# Patient Record
Sex: Female | Born: 1935 | Race: White | Hispanic: No | Smoking: Never smoker
Health system: Southern US, Community
[De-identification: ages and names within clinical notes are randomized; demographics above are authoritative.]

## PROBLEM LIST (undated history)

## (undated) DIAGNOSIS — R569 Unspecified convulsions: Secondary | ICD-10-CM

## (undated) DIAGNOSIS — F0390 Unspecified dementia without behavioral disturbance: Secondary | ICD-10-CM

## (undated) DIAGNOSIS — E079 Disorder of thyroid, unspecified: Secondary | ICD-10-CM

## (undated) DIAGNOSIS — M1990 Unspecified osteoarthritis, unspecified site: Secondary | ICD-10-CM

## (undated) DIAGNOSIS — F419 Anxiety disorder, unspecified: Secondary | ICD-10-CM

## (undated) DIAGNOSIS — H409 Unspecified glaucoma: Secondary | ICD-10-CM

## (undated) DIAGNOSIS — I10 Essential (primary) hypertension: Secondary | ICD-10-CM

## (undated) HISTORY — PX: NASAL SINUS SURGERY: SHX719

## (undated) HISTORY — PX: HERNIA REPAIR: SHX51

---

## 2015-05-14 ENCOUNTER — Encounter (HOSPITAL_COMMUNITY): Payer: Self-pay

## 2015-05-14 ENCOUNTER — Emergency Department (HOSPITAL_COMMUNITY)
Admission: EM | Admit: 2015-05-14 | Discharge: 2015-05-14 | Disposition: A | Discharge status: Home / Self Care | Payer: Medicare Other | Attending: Emergency Medicine | Admitting: Emergency Medicine

## 2015-05-14 ENCOUNTER — Emergency Department (HOSPITAL_COMMUNITY): Payer: Medicare Other

## 2015-05-14 DIAGNOSIS — R410 Disorientation, unspecified: Secondary | ICD-10-CM | POA: Diagnosis not present

## 2015-05-14 DIAGNOSIS — Z7982 Long term (current) use of aspirin: Secondary | ICD-10-CM | POA: Diagnosis not present

## 2015-05-14 DIAGNOSIS — E079 Disorder of thyroid, unspecified: Secondary | ICD-10-CM | POA: Diagnosis not present

## 2015-05-14 DIAGNOSIS — H409 Unspecified glaucoma: Secondary | ICD-10-CM | POA: Diagnosis not present

## 2015-05-14 DIAGNOSIS — F419 Anxiety disorder, unspecified: Secondary | ICD-10-CM | POA: Diagnosis not present

## 2015-05-14 DIAGNOSIS — I10 Essential (primary) hypertension: Secondary | ICD-10-CM | POA: Insufficient documentation

## 2015-05-14 DIAGNOSIS — Z79899 Other long term (current) drug therapy: Secondary | ICD-10-CM | POA: Insufficient documentation

## 2015-05-14 DIAGNOSIS — M1990 Unspecified osteoarthritis, unspecified site: Secondary | ICD-10-CM | POA: Insufficient documentation

## 2015-05-14 DIAGNOSIS — R4182 Altered mental status, unspecified: Secondary | ICD-10-CM | POA: Diagnosis present

## 2015-05-14 HISTORY — DX: Unspecified osteoarthritis, unspecified site: M19.90

## 2015-05-14 HISTORY — DX: Unspecified convulsions: R56.9

## 2015-05-14 HISTORY — DX: Disorder of thyroid, unspecified: E07.9

## 2015-05-14 HISTORY — DX: Essential (primary) hypertension: I10

## 2015-05-14 HISTORY — DX: Anxiety disorder, unspecified: F41.9

## 2015-05-14 HISTORY — DX: Unspecified glaucoma: H40.9

## 2015-05-14 LAB — URINALYSIS, ROUTINE W REFLEX MICROSCOPIC
BILIRUBIN URINE: NEGATIVE
Glucose, UA: NEGATIVE mg/dL
HGB URINE DIPSTICK: NEGATIVE
Ketones, ur: NEGATIVE mg/dL
Leukocytes, UA: NEGATIVE
Nitrite: NEGATIVE
PH: 6 (ref 5.0–8.0)
Protein, ur: NEGATIVE mg/dL
SPECIFIC GRAVITY, URINE: 1.018 (ref 1.005–1.030)

## 2015-05-14 LAB — CBC
HEMATOCRIT: 44.9 % (ref 36.0–46.0)
HEMOGLOBIN: 15.4 g/dL — AB (ref 12.0–15.0)
MCH: 31.8 pg (ref 26.0–34.0)
MCHC: 34.3 g/dL (ref 30.0–36.0)
MCV: 92.8 fL (ref 78.0–100.0)
Platelets: 208 10*3/uL (ref 150–400)
RBC: 4.84 MIL/uL (ref 3.87–5.11)
RDW: 13.3 % (ref 11.5–15.5)
WBC: 6.1 10*3/uL (ref 4.0–10.5)

## 2015-05-14 LAB — COMPREHENSIVE METABOLIC PANEL
ALBUMIN: 4.1 g/dL (ref 3.5–5.0)
ALK PHOS: 121 U/L (ref 38–126)
ALT: 23 U/L (ref 14–54)
ANION GAP: 9 (ref 5–15)
AST: 29 U/L (ref 15–41)
BUN: 15 mg/dL (ref 6–20)
CHLORIDE: 103 mmol/L (ref 101–111)
CO2: 27 mmol/L (ref 22–32)
Calcium: 10 mg/dL (ref 8.9–10.3)
Creatinine, Ser: 0.7 mg/dL (ref 0.44–1.00)
GFR calc non Af Amer: >60 mL/min (ref >60)
GLUCOSE: 110 mg/dL — AB (ref 65–99)
POTASSIUM: 4.1 mmol/L (ref 3.5–5.1)
SODIUM: 139 mmol/L (ref 135–145)
Total Bilirubin: 0.6 mg/dL (ref 0.3–1.2)
Total Protein: 6.9 g/dL (ref 6.5–8.1)

## 2015-05-14 NOTE — ED Provider Notes (Signed)
CSN: [id number]     Arrival date & time 05/14/15  1439 History   First MD Initiated Contact with Patient 05/14/15 1717     Chief Complaint  Patient presents with  . Altered Mental Status     (Consider location/radiation/quality/duration/timing/severity/associated sxs/prior Treatment) Patient is a 81 y.o. female presenting with altered mental status.  Altered Mental Status Presenting symptoms: combativeness, confusion and disorientation   Severity:  Moderate Episode history:  Continuous Duration: ongoing for weeks. Timing:  Constant Progression:  Worsening Context: dementia   Context comment:  Fell 2 weeks ago, no head CT performed Associated symptoms: no abdominal pain and no fever     Past Medical History  Diagnosis Date  . Seizures (HCC)   . Hypertension   . Thyroid disease   . Arthritis   . Glaucoma   . Anxiety    Past Surgical History  Procedure Laterality Date  . Hernia repair    . Nasal sinus surgery     Family History  Problem Relation Age of Onset  . Family history unknown: Yes   Social History  Substance Use Topics  . Smoking status: Never Smoker   . Smokeless tobacco: None  . Alcohol Use: No   OB History    Gravida Para Term Preterm AB TAB SAB Ectopic Multiple Living   3         3     Review of Systems  Constitutional: Negative for fever.  Gastrointestinal: Negative for abdominal pain.  Psychiatric/Behavioral: Positive for confusion.  All other systems reviewed and are negative.     Allergies  Review of patient's allergies indicates no known allergies.  Home Medications   Prior to Admission medications   Medication Sig Start Date End Date Taking? Authorizing Provider  amLODipine (NORVASC) 5 MG tablet Take 5 mg by mouth daily. 05/02/15  Yes Historical Provider, MD  aspirin EC 81 MG tablet Take 81 mg by mouth daily.   Yes Historical Provider, MD  calcitRIOL (ROCALTROL) 0.25 MCG capsule Take 0.25 mcg by mouth daily. 03/10/15  Yes Historical  Provider, MD  Calcium Citrate-Vitamin D (CITRACAL + D PO) Take 1 tablet by mouth daily.   Yes Historical Provider, MD  dorzolamide-timolol (COSOPT) 22.3-6.8 MG/ML ophthalmic solution Place 1 drop into both eyes 2 (two) times daily. 08/09/13  Yes Historical Provider, MD  FLUoxetine (PROZAC) 10 MG capsule Take 10 mg by mouth daily. 03/19/15  Yes Historical Provider, MD  HYDROcodone-acetaminophen (NORCO/VICODIN) 5-325 MG tablet Take 1 tablet by mouth every 6 (six) hours as needed for moderate pain.   Yes Historical Provider, MD  levETIRAcetam (KEPPRA) 750 MG tablet Take 750 mg by mouth 2 (two) times daily. 02/19/15  Yes Historical Provider, MD  levothyroxine (SYNTHROID, LEVOTHROID) 50 MCG tablet Take 50 mcg by mouth daily. 04/03/15  Yes Historical Provider, MD  Multiple Vitamin (MULTIVITAMIN WITH MINERALS) TABS tablet Take 1 tablet by mouth daily.   Yes Historical Provider, MD   BP 166/78 mmHg  Pulse 81  Temp(Src) 97.5 F (36.4 C) (Oral)  Resp 18  SpO2 98% Physical Exam  Constitutional: She appears well-developed and well-nourished.  HENT:  Head: Normocephalic and atraumatic.  Right Ear: External ear normal.  Left Ear: External ear normal.  Eyes: Conjunctivae and EOM are normal. Pupils are equal, round, and reactive to light.  Neck: Normal range of motion. Neck supple.  Cardiovascular: Normal rate, regular rhythm, normal heart sounds and intact distal pulses.   Pulmonary/Chest: Effort normal and breath sounds normal.  Abdominal: Soft. Bowel sounds are normal. There is no tenderness.  Musculoskeletal: Normal range of motion.  Neurological: She is alert. She has normal strength. No cranial nerve deficit or sensory deficit. GCS eye subscore is 4. GCS verbal subscore is 4. GCS motor subscore is 6.  Skin: Skin is warm and dry.  Vitals reviewed.   ED Course  Procedures (including critical care time) Labs Review Labs Reviewed  COMPREHENSIVE METABOLIC PANEL - Abnormal; Notable for the  following:    Glucose, Bld 110 (*)    All other components within normal limits  CBC - Abnormal; Notable for the following:    Hemoglobin 15.4 (*)    All other components within normal limits  URINE CULTURE  URINALYSIS, ROUTINE W REFLEX MICROSCOPIC (NOT AT [facility])  CBG MONITORING, ED    Imaging Review Dg Chest 2 View  05/14/2015  CLINICAL DATA:  Increasing confusion. EXAM: CHEST  2 VIEW COMPARISON:  None FINDINGS: Normal heart size. Aortic atherosclerosis identified. Lungs are hyperinflated and there are coarsened interstitial markings bilaterally. The no superimposed airspace consolidation. Upper and lower thoracic spine compression deformities are identified NR age indeterminate. Deformity involving the proximal left humerus is suspected however this is only partially visualized. IMPRESSION: 1. No acute cardiopulmonary abnormalities. 2. Suspect age-indeterminate deformity involving the proximal left humerus. If there are clinical signs or symptoms of left humerus fracture recommend dedicated radiographs of the left upper extremity. Electronically Signed   By: Signa Kellaylor  Stroud M.D.   On: 05/14/2015 19:10   Ct Head Wo Contrast  05/14/2015  CLINICAL DATA:  Patient with altered mental status after fall. No visible injury. EXAM: CT HEAD WITHOUT CONTRAST TECHNIQUE: Contiguous axial images were obtained from the base of the skull through the vertex without intravenous contrast. COMPARISON:  None. FINDINGS: Ventricles and sulci are appropriate for patient's age. Periventricular and subcortical white matter hypodensity compatible with chronic small vessel ischemic changes. No evidence for acute cortically based infarct, intracranial hemorrhage, mass lesion mass effect. Orbits are unremarkable. Hyperostosis of the left maxillary sinus. Mucosal thickening within the frontal sinus. Mastoid air cells unremarkable. Calvarium is intact. IMPRESSION: No acute intracranial process. Chronic small vessel ischemic  changes. Electronically Signed   By: Annia Beltrew  Davis M.D.   On: 05/14/2015 18:44   I have personally reviewed and evaluated these images and lab results as part of my medical decision-making.   EKG Interpretation None      MDM   Final diagnoses:  Confusion    81 y.o. female with pertinent PMH of dementia, prior seizures, anxiety presents with progressively worsening mental status without acute change.  On arrival vitals and physical exam as above.  Description of events including paranoia, intermittent agnosia, and lack of motor or sensory symptoms makes dementia likely diagnosis.  Wu unremarkable for acute pathology.  DC home to fu with pcp.    I have reviewed all laboratory and imaging studies if ordered as above  1. Confusion         Mirian MoMatthew Gentry, MD 05/14/15 2018

## 2015-05-14 NOTE — ED Notes (Signed)
Pt daughter states increasing confusion and not recognizing family members. Daughter states she fell 2 weeks ago and [facility] in [place]. Hx of dementia.

## 2015-05-14 NOTE — Discharge Instructions (Signed)

## 2015-05-15 LAB — URINE CULTURE

## 2016-07-22 ENCOUNTER — Emergency Department (HOSPITAL_COMMUNITY): Payer: Medicare Other

## 2016-07-22 ENCOUNTER — Emergency Department (HOSPITAL_COMMUNITY)
Admission: EM | Admit: 2016-07-22 | Discharge: 2016-07-22 | Disposition: A | Discharge status: Home / Self Care | Payer: Medicare Other | Attending: Physician Assistant | Admitting: Physician Assistant

## 2016-07-22 ENCOUNTER — Encounter (HOSPITAL_COMMUNITY): Payer: Self-pay | Admitting: Emergency Medicine

## 2016-07-22 DIAGNOSIS — Y939 Activity, unspecified: Secondary | ICD-10-CM | POA: Insufficient documentation

## 2016-07-22 DIAGNOSIS — R0789 Other chest pain: Secondary | ICD-10-CM | POA: Insufficient documentation

## 2016-07-22 DIAGNOSIS — S0990XA Unspecified injury of head, initial encounter: Secondary | ICD-10-CM | POA: Diagnosis present

## 2016-07-22 DIAGNOSIS — Y999 Unspecified external cause status: Secondary | ICD-10-CM | POA: Insufficient documentation

## 2016-07-22 DIAGNOSIS — Z7982 Long term (current) use of aspirin: Secondary | ICD-10-CM | POA: Diagnosis not present

## 2016-07-22 DIAGNOSIS — Y929 Unspecified place or not applicable: Secondary | ICD-10-CM | POA: Insufficient documentation

## 2016-07-22 DIAGNOSIS — W19XXXA Unspecified fall, initial encounter: Secondary | ICD-10-CM | POA: Diagnosis not present

## 2016-07-22 DIAGNOSIS — Z23 Encounter for immunization: Secondary | ICD-10-CM | POA: Diagnosis not present

## 2016-07-22 DIAGNOSIS — Z79899 Other long term (current) drug therapy: Secondary | ICD-10-CM | POA: Insufficient documentation

## 2016-07-22 DIAGNOSIS — I10 Essential (primary) hypertension: Secondary | ICD-10-CM | POA: Insufficient documentation

## 2016-07-22 DIAGNOSIS — S0181XA Laceration without foreign body of other part of head, initial encounter: Secondary | ICD-10-CM | POA: Diagnosis not present

## 2016-07-22 HISTORY — DX: Unspecified dementia, unspecified severity, without behavioral disturbance, psychotic disturbance, mood disturbance, and anxiety: F03.90

## 2016-07-22 MED ORDER — TETANUS-DIPHTH-ACELL PERTUSSIS 5-2.5-18.5 LF-MCG/0.5 IM SUSP
0.5000 mL | Freq: Once | INTRAMUSCULAR | Status: AC
  Administered 2016-07-22: 0.5 mL via INTRAMUSCULAR
  Filled 2016-07-22: qty 0.5

## 2016-07-22 MED ORDER — HYDROCODONE-ACETAMINOPHEN 5-325 MG PO TABS
1.0000 | ORAL_TABLET | Freq: Once | ORAL | Status: AC
  Administered 2016-07-22: 1 via ORAL
  Filled 2016-07-22: qty 1

## 2016-07-22 NOTE — ED Triage Notes (Signed)
Per Es, [facility] [address] Pt had an unwitnessed fall, hx of dementia. Pt has hematoma and abrasian to Left side of forehead, bleeding controlled. Pt complains of Left shoulder and side pain. Pt not on any blood thinners. Marland Kitchen.BP- 188/98, HR 72, O2 94, CBG-107.

## 2016-07-22 NOTE — ED Provider Notes (Signed)
[facility]-EMERGENCY DEPT Provider Note   CSN: [id number] Arrival date & time: 07/22/16  1740     History   Chief Complaint Chief Complaint  Patient presents with  . Fall    HPI Ledia Cihlar is a 83 y.o. female.  HPI   Patient is a 83 year old female with advanced dementia presenting after mechanical fall. Patient had multiple falls in past. Patient has dried blood in her nares.   Level V altered mental status.  Past Medical History:  Diagnosis Date  . Anxiety   . Arthritis   . Dementia   . Glaucoma   . Hypertension   . Seizures (HCC)   . Thyroid disease     There are no active problems to display for this patient.   Past Surgical History:  Procedure Laterality Date  . HERNIA REPAIR    . NASAL SINUS SURGERY      OB History    Gravida Para Term Preterm AB Living   3         3   SAB TAB Ectopic Multiple Live Births                   Home Medications    Prior to Admission medications   Medication Sig Start Date End Date Taking? Authorizing Provider  amLODipine (NORVASC) 5 MG tablet Take 5 mg by mouth daily. 05/02/15   Historical Provider, MD  aspirin EC 81 MG tablet Take 81 mg by mouth daily.    Historical Provider, MD  calcitRIOL (ROCALTROL) 0.25 MCG capsule Take 0.25 mcg by mouth daily. 03/10/15   Historical Provider, MD  Calcium Citrate-Vitamin D (CITRACAL + D PO) Take 1 tablet by mouth daily.    Historical Provider, MD  dorzolamide-timolol (COSOPT) 22.3-6.8 MG/ML ophthalmic solution Place 1 drop into both eyes 2 (two) times daily. 08/09/13   Historical Provider, MD  FLUoxetine (PROZAC) 10 MG capsule Take 10 mg by mouth daily. 03/19/15   Historical Provider, MD  HYDROcodone-acetaminophen (NORCO/VICODIN) 5-325 MG tablet Take 1 tablet by mouth every 6 (six) hours as needed for moderate pain.    Historical Provider, MD  levETIRAcetam (KEPPRA) 750 MG tablet Take 750 mg by mouth 2 (two) times daily. 02/19/15   Historical Provider, MD  levothyroxine (SYNTHROID,  LEVOTHROID) 50 MCG tablet Take 50 mcg by mouth daily. 04/03/15   Historical Provider, MD  Multiple Vitamin (MULTIVITAMIN WITH MINERALS) TABS tablet Take 1 tablet by mouth daily.    Historical Provider, MD    Family History Family History  Problem Relation Age of Onset  . Family history unknown: Yes    Social History Social History  Substance Use Topics  . Smoking status: Never Smoker  . Smokeless tobacco: Never Used  . Alcohol use No     Allergies   Patient has no known allergies.   Review of Systems Review of Systems  Unable to perform ROS: Dementia     Physical Exam Updated Vital Signs BP 189/84 (BP Location: Right Arm)   Pulse 73   Temp 97.6 F (36.4 C) (Oral)   Resp 16   SpO2 96%   Physical Exam  Constitutional: She appears well-developed and well-nourished. No distress.  HENT:  Head: Normocephalic and atraumatic.  Dried blood to the nares. Skin tear to left temporal region.  Eyes: Conjunctivae and EOM are normal. Right eye exhibits no discharge. Left eye exhibits no discharge.  Neck: Neck supple.  Cardiovascular: Normal rate and regular rhythm.   No murmur heard. Pulmonary/Chest:  Effort normal and breath sounds normal. No respiratory distress.  Tenderness to left chest wall.  Abdominal: Soft. There is no tenderness.  Musculoskeletal: She exhibits no edema.  Neurological: She is alert. No cranial nerve deficit.  Oriented to self only.  Skin: Skin is warm and dry.  Psychiatric: She has a normal mood and affect.  Nursing note and vitals reviewed.    ED Treatments / Results  Labs (all labs ordered are listed, but only abnormal results are displayed) Labs Reviewed - No data to display  EKG  EKG Interpretation None       Radiology No results found.  Procedures Procedures (including critical care time)  Medications Ordered in ED Medications  Tdap (BOOSTRIX) injection 0.5 mL (not administered)     Initial Impression / Assessment and Plan  / ED Course  I have reviewed the triage vital signs and the nursing notes.  Pertinent labs & imaging results that were available during my care of the patient were reviewed by me and considered in my medical decision making (see chart for details).      Patient is a 83 year old female presenting with fall. Patient has advanced dementia. Patient's daughter is at bedside. We'll get CT head and neck as well as chest x-ray. We offered labs and urine but patient's family member declined at this time.  Imaging reassuring.  Given friabuility, skin tear treated with bandaging. Will have her continue to use vicodin as she is already prescribed at her NH.  Patient is comfortable, ambulatory, and taking PO at time of discharge.  Patient's fammily  expressed understanding about return precautions.    Final Clinical Impressions(s) / ED Diagnoses   Final diagnoses:  None    New Prescriptions New Prescriptions   No medications on file     Daylan Boggess Randall AnLyn Alvah Gilder, MD 07/22/16 2351

## 2016-07-22 NOTE — Discharge Instructions (Signed)
Mrs. Middlekauff was seen here after falling. Her CT head neck and her chest x-ray were normal. However as wetalked about she has a high risk for developing pneumonia. Please be careful when she is walking around that she does not trip and fall. Patient may take her Vicodin as needed at home as prescribed previously.

## 2016-10-21 ENCOUNTER — Ambulatory Visit
Admission: RE | Admit: 2016-10-21 | Discharge: 2016-10-21 | Disposition: A | Discharge status: Home / Self Care | Payer: Medicare Other | Source: Ambulatory Visit | Attending: Geriatric Medicine | Admitting: Geriatric Medicine

## 2016-10-21 ENCOUNTER — Other Ambulatory Visit: Payer: Self-pay | Admitting: Geriatric Medicine

## 2016-10-21 DIAGNOSIS — M545 Low back pain, unspecified: Secondary | ICD-10-CM

## 2018-06-29 ENCOUNTER — Encounter (HOSPITAL_COMMUNITY): Payer: Self-pay

## 2018-06-29 ENCOUNTER — Inpatient Hospital Stay (HOSPITAL_COMMUNITY)
Admission: EM | Admit: 2018-06-29 | Discharge: 2018-07-03 | DRG: 480 | Disposition: A | Discharge status: Skilled Nursing Facility | Payer: Medicare Other | Source: Skilled Nursing Facility | Attending: Internal Medicine | Admitting: Internal Medicine

## 2018-06-29 DIAGNOSIS — D649 Anemia, unspecified: Secondary | ICD-10-CM | POA: Diagnosis present

## 2018-06-29 DIAGNOSIS — Z66 Do not resuscitate: Secondary | ICD-10-CM | POA: Diagnosis present

## 2018-06-29 DIAGNOSIS — S0003XA Contusion of scalp, initial encounter: Secondary | ICD-10-CM | POA: Diagnosis present

## 2018-06-29 DIAGNOSIS — S72141A Displaced intertrochanteric fracture of right femur, initial encounter for closed fracture: Secondary | ICD-10-CM | POA: Diagnosis not present

## 2018-06-29 DIAGNOSIS — D62 Acute posthemorrhagic anemia: Secondary | ICD-10-CM | POA: Diagnosis not present

## 2018-06-29 DIAGNOSIS — R569 Unspecified convulsions: Secondary | ICD-10-CM

## 2018-06-29 DIAGNOSIS — Z7982 Long term (current) use of aspirin: Secondary | ICD-10-CM

## 2018-06-29 DIAGNOSIS — Z419 Encounter for procedure for purposes other than remedying health state, unspecified: Secondary | ICD-10-CM

## 2018-06-29 DIAGNOSIS — G40909 Epilepsy, unspecified, not intractable, without status epilepticus: Secondary | ICD-10-CM | POA: Diagnosis present

## 2018-06-29 DIAGNOSIS — W01198A Fall on same level from slipping, tripping and stumbling with subsequent striking against other object, initial encounter: Secondary | ICD-10-CM | POA: Diagnosis present

## 2018-06-29 DIAGNOSIS — Y92121 Bathroom in nursing home as the place of occurrence of the external cause: Secondary | ICD-10-CM

## 2018-06-29 DIAGNOSIS — I10 Essential (primary) hypertension: Secondary | ICD-10-CM | POA: Diagnosis present

## 2018-06-29 DIAGNOSIS — H409 Unspecified glaucoma: Secondary | ICD-10-CM | POA: Diagnosis present

## 2018-06-29 DIAGNOSIS — F0390 Unspecified dementia without behavioral disturbance: Secondary | ICD-10-CM | POA: Diagnosis present

## 2018-06-29 DIAGNOSIS — S72009A Fracture of unspecified part of neck of unspecified femur, initial encounter for closed fracture: Secondary | ICD-10-CM | POA: Diagnosis present

## 2018-06-29 DIAGNOSIS — S72001A Fracture of unspecified part of neck of right femur, initial encounter for closed fracture: Secondary | ICD-10-CM | POA: Diagnosis not present

## 2018-06-29 DIAGNOSIS — E039 Hypothyroidism, unspecified: Secondary | ICD-10-CM | POA: Diagnosis present

## 2018-06-29 DIAGNOSIS — D509 Iron deficiency anemia, unspecified: Secondary | ICD-10-CM | POA: Diagnosis present

## 2018-06-29 DIAGNOSIS — S60221A Contusion of right hand, initial encounter: Secondary | ICD-10-CM | POA: Diagnosis present

## 2018-06-29 DIAGNOSIS — Z800 Family history of malignant neoplasm of digestive organs: Secondary | ICD-10-CM

## 2018-06-29 DIAGNOSIS — E43 Unspecified severe protein-calorie malnutrition: Secondary | ICD-10-CM | POA: Diagnosis present

## 2018-06-29 DIAGNOSIS — Z9181 History of falling: Secondary | ICD-10-CM

## 2018-06-29 NOTE — ED Triage Notes (Signed)
Pt comes via [facility] from [facility] for a fall this afternoon, pt had a xray tonight with a R femoral neck fracture, PTA recieved fentanyl

## 2018-06-29 NOTE — ED Provider Notes (Signed)
[facility] EMERGENCY DEPARTMENT Provider Note   CSN: [id number] Arrival date & time: 06/29/18  2326     History   Chief Complaint Chief Complaint  Patient presents with  . Fall    HPI Rachel Barry is a 85 y.o. female.  The history is provided by the patient and medical records.  Fall     LEVEL V CAVEAT:  DEMENTIA  85 y.o. F with hx of anxiety, arthritis, dementia, glaucoma, HTN, seizures, thyroid disease, presenting to the ED following a fall.  Patient resides at [facility], [employer] [employer]  She was standing up after using the bathroom today when she fell onto her right hip.  She did strike her head against the toilet, unsure about LOC.  She was complaining of hip pain, outside x-ray with right femoral neck fracture.  Also had right shoulder and chest x-ray which were normal.  Patient does take daily ASA, no other anticoagulation.  No prior hip injuries or surgeries.  Not established with orthopedics.  Daughter reports she is able to ambulate a little with her walker, states she seems to shuffle a bit.  Past Medical History:  Diagnosis Date  . Anxiety   . Arthritis   . Dementia (HCC)   . Glaucoma   . Hypertension   . Seizures (HCC)   . Thyroid disease     There are no active problems to display for this patient.   Past Surgical History:  Procedure Laterality Date  . HERNIA REPAIR    . NASAL SINUS SURGERY       OB History    Gravida  3   Para      Term      Preterm      AB      Living  3     SAB      TAB      Ectopic      Multiple      Live Births               Home Medications    Prior to Admission medications   Medication Sig Start Date End Date Taking? Authorizing Provider  acetaminophen (TYLENOL) 500 MG tablet Take 500 mg by mouth 3 (three) times daily. Taking every day per [facility]    [provider]  amLODipine (NORVASC) 2.5 MG tablet Take 2.5 mg by mouth daily.    [provider]  aspirin EC 81 MG tablet  Take 81 mg by mouth daily.    [provider]  busPIRone (BUSPAR) 5 MG tablet Take 5 mg by mouth 2 (two) times daily.    [provider]  calcitRIOL (ROCALTROL) 0.25 MCG capsule Take 0.25 mcg by mouth daily. 03/10/15   [provider]  Calcium Citrate-Vitamin D (CITRACAL + D PO) Take 1 tablet by mouth daily.    [provider]  Cranberry-Vitamin C-Inulin (UTI-STAT) LIQD Take 30 mLs by mouth daily.    [provider]  docusate sodium (COLACE) 100 MG capsule Take 100 mg by mouth daily.    [provider]  dorzolamide-timolol (COSOPT) 22.3-6.8 MG/ML ophthalmic solution Place 1 drop into both eyes 2 (two) times daily. Preservative Free 08/09/13   [provider]  FLUoxetine (PROZAC) 40 MG capsule Take 40 mg by mouth daily.    [provider]  HYDROcodone-acetaminophen (NORCO/VICODIN) 5-325 MG tablet Take 1 tablet by mouth every 6 (six) hours as needed for moderate pain.    [provider]  levETIRAcetam (KEPPRA) 500 MG tablet Take 500 mg by mouth 2 (two) times daily.    [provider]  levothyroxine (SYNTHROID, LEVOTHROID) 50 MCG tablet Take 50 mcg by mouth daily. 04/03/15   [provider]  memantine (NAMENDA XR) 28 MG CP24 24 hr capsule Take 28 mg by mouth.    [provider]  Multiple Vitamin (MULTIVITAMIN WITH MINERALS) TABS tablet Take 1 tablet by mouth daily.    [provider]  polyethylene glycol (MIRALAX / GLYCOLAX) packet Take 17 g by mouth daily.    [provider]  triamcinolone cream (KENALOG) 0.1 % Apply 1 application topically daily as needed.    [provider]    Family History Family History  Family history unknown: Yes    Social History Social History   Tobacco Use  . Smoking status: Never Smoker  . Smokeless tobacco: Never Used  Substance Use Topics  . Alcohol use: No  . Drug use: No     Allergies   Patient has no known  allergies.   Review of Systems Review of Systems  Musculoskeletal: Positive for arthralgias.  All other systems reviewed and are negative.    Physical Exam Updated Vital Signs BP (!) 147/81   Pulse 68   Temp 98.2 F (36.8 C) (Oral)   Resp 18   SpO2 92%   Physical Exam Vitals signs and nursing note reviewed.  Constitutional:      Appearance: She is well-developed.     Comments: Elderly, extremely hard of hearing  HENT:     Head: Normocephalic and atraumatic.     Comments: Small hematoma to right side of head, no open wound or laceration Eyes:     Conjunctiva/sclera: Conjunctivae normal.     Pupils: Pupils are equal, round, and reactive to light.  Neck:     Musculoskeletal: Normal range of motion.  Cardiovascular:     Rate and Rhythm: Normal rate and regular rhythm.     Heart sounds: Normal heart sounds.  Pulmonary:     Effort: Pulmonary effort is normal.     Breath sounds: Normal breath sounds.  Abdominal:     General: Bowel sounds are normal.     Palpations: Abdomen is soft.  Musculoskeletal: Normal range of motion.     Comments: Pelvis wrapped in sheet Right hip appears to be tender to touch, there is some leg shortening and external rotation, DP pulse intact Bruising noted to left lower leg, no apparent deformity; DP pulse intact  Skin:    General: Skin is warm and dry.  Neurological:     Mental Status: She is alert.     Comments: Awake, able to answer questions when speaking directly into ear, limited movement of right leg due to injury but moving other extremities when prompted      ED Treatments / Results  Labs (all labs ordered are listed, but only abnormal results are displayed) Labs Reviewed  BASIC METABOLIC PANEL - Abnormal; Notable for the following components:      Result Value   Glucose, Bld 136 (*)    Calcium 8.6 (*)    All other components within normal limits  CBC WITH DIFFERENTIAL/PLATELET - Abnormal; Notable for the following components:    WBC 11.9 (*)    RBC 3.46 (*)    Hemoglobin 8.8 (*)    HCT 30.5 (*)    MCH 25.4 (*)    MCHC 28.9 (*)    RDW 16.0 (*)  Neutro Abs 10.6 (*)    Lymphs Abs 0.5 (*)    All other components within normal limits  PROTIME-INR  URINALYSIS, ROUTINE W REFLEX MICROSCOPIC  TYPE AND SCREEN  ABO/RH    EKG None  Radiology Dg Chest 1 View  Result Date: 06/30/2018 CLINICAL DATA:  Initial evaluation for acute trauma, hip fracture. EXAM: CHEST  1 VIEW COMPARISON:  Prior radiograph from 07/22/2016. FINDINGS: Cardiomegaly, stable. Mediastinal silhouette within normal limits. Aortic atherosclerosis. Lungs somewhat hyperinflated with changes related to underlying COPD. No focal infiltrates. No edema or effusion. No appreciable pneumothorax on this supine view the chest. Diffuse osteopenia.  No acute osseous abnormality. IMPRESSION: 1. No active cardiopulmonary disease. 2. Underlying COPD. 3. Stable cardiomegaly without edema. 4. Aortic atherosclerosis. Electronically Signed   By: Rise Mu M.D.   On: 06/30/2018 00:55   Dg Tibia/fibula Left  Result Date: 06/30/2018 CLINICAL DATA:  Initial evaluation for acute trauma, fall. EXAM: LEFT TIBIA AND FIBULA - 2 VIEW COMPARISON:  None. FINDINGS: No acute fracture or dislocation. Diffuse osteopenia noted. No acute soft tissue abnormality. Vascular calcifications noted within the visualized leg. Degenerative osteoarthrosis with chondrocalcinosis noted at the knee. IMPRESSION: No acute osseous abnormality about the left tibia/fibula. Electronically Signed   By: Rise Mu M.D.   On: 06/30/2018 00:58   Ct Head Wo Contrast  Result Date: 06/30/2018 CLINICAL DATA:  Head trauma, minor, pt on anticoagulation; Head trauma, minor, GCS>=13, high clinical risk, initial exam. One [age] year old post fall this afternoon. EXAM: CT HEAD WITHOUT CONTRAST CT CERVICAL SPINE WITHOUT CONTRAST TECHNIQUE: Multidetector CT imaging of the head and cervical spine was  performed following the standard protocol without intravenous contrast. Multiplanar CT image reconstructions of the cervical spine were also generated. COMPARISON:  CT 07/22/2016 FINDINGS: CT HEAD FINDINGS Brain: Unchanged atrophy and chronic small vessel ischemia. No intracranial hemorrhage, mass effect, or midline shift. No hydrocephalus. The basilar cisterns are patent. No evidence of territorial infarct or acute ischemia. No extra-axial or intracranial fluid collection. Vascular: Atherosclerosis of skullbase vasculature without hyperdense vessel or abnormal calcification. Skull: No fracture or focal lesion. Sinuses/Orbits: Postsurgical change of the paranasal sinuses without acute inflammation. Mastoid air cells are clear. No acute orbital abnormality. Other: None. CT CERVICAL SPINE FINDINGS Patient had difficulty tolerating the exam, there is motion artifact. Alignment: No traumatic subluxation. Minimal anterolisthesis of C4 on C5 and C7 on T1, chronic and likely facet mediated. Skull base and vertebrae: No evidence of fracture, motion limits detailed assessment. The vertebral body heights are preserved. Degenerative change at C1-C2 again seen. Soft tissues and spinal canal: No prevertebral fluid or swelling. No visible canal hematoma. Disc levels: Disc space narrowing and endplate spurring, most prominent at C6-C7, unchanged from prior exam. Multilevel facet arthropathy. Upper chest: Biapical pleuroparenchymal scarring. No acute findings. Other: Carotid calcifications. IMPRESSION: 1. No acute intracranial abnormality. No skull fracture. Stable atrophy and chronic small vessel ischemia. 2. Motion limited evaluation of the cervical spine. No evidence of acute fracture. Degenerative change is grossly stable from 2018. Electronically Signed   By: Narda Rutherford M.D.   On: 06/30/2018 00:36   Ct Cervical Spine Wo Contrast  Result Date: 06/30/2018 CLINICAL DATA:  Head trauma, minor, pt on anticoagulation; Head  trauma, minor, GCS>=13, high clinical risk, initial exam. One [age] year old post fall this afternoon. EXAM: CT HEAD WITHOUT CONTRAST CT CERVICAL SPINE WITHOUT CONTRAST TECHNIQUE: Multidetector CT imaging of the head and cervical spine was performed following the standard protocol without  intravenous contrast. Multiplanar CT image reconstructions of the cervical spine were also generated. COMPARISON:  CT 07/22/2016 FINDINGS: CT HEAD FINDINGS Brain: Unchanged atrophy and chronic small vessel ischemia. No intracranial hemorrhage, mass effect, or midline shift. No hydrocephalus. The basilar cisterns are patent. No evidence of territorial infarct or acute ischemia. No extra-axial or intracranial fluid collection. Vascular: Atherosclerosis of skullbase vasculature without hyperdense vessel or abnormal calcification. Skull: No fracture or focal lesion. Sinuses/Orbits: Postsurgical change of the paranasal sinuses without acute inflammation. Mastoid air cells are clear. No acute orbital abnormality. Other: None. CT CERVICAL SPINE FINDINGS Patient had difficulty tolerating the exam, there is motion artifact. Alignment: No traumatic subluxation. Minimal anterolisthesis of C4 on C5 and C7 on T1, chronic and likely facet mediated. Skull base and vertebrae: No evidence of fracture, motion limits detailed assessment. The vertebral body heights are preserved. Degenerative change at C1-C2 again seen. Soft tissues and spinal canal: No prevertebral fluid or swelling. No visible canal hematoma. Disc levels: Disc space narrowing and endplate spurring, most prominent at C6-C7, unchanged from prior exam. Multilevel facet arthropathy. Upper chest: Biapical pleuroparenchymal scarring. No acute findings. Other: Carotid calcifications. IMPRESSION: 1. No acute intracranial abnormality. No skull fracture. Stable atrophy and chronic small vessel ischemia. 2. Motion limited evaluation of the cervical spine. No evidence of acute fracture.  Degenerative change is grossly stable from 2018. Electronically Signed   By: Narda RutherfordMelanie  Sanford M.D.   On: 06/30/2018 00:36   Dg Hand Complete Right  Result Date: 06/30/2018 CLINICAL DATA:  Initial evaluation for acute trauma, fall. EXAM: RIGHT HAND - COMPLETE 3+ VIEW COMPARISON:  None. FINDINGS: No acute fracture or dislocation. Moderate to advanced osteoarthritic changes present throughout the hand, most notable at the right [facility]. Angulation at the right first MCP and IP joints likely chronic and degenerative. Suspected remotely healed fracture of the mid right fifth metacarpal shaft. No acute soft tissue abnormality. Diffuse osteopenia noted. IMPRESSION: No acute osseous abnormality about the right hand. Electronically Signed   By: Rise MuBenjamin  McClintock M.D.   On: 06/30/2018 00:57   Dg Hip Unilat With Pelvis 2-3 Views Right  Result Date: 06/30/2018 CLINICAL DATA:  Initial evaluation for acute trauma, fall. EXAM: DG HIP (WITH OR WITHOUT PELVIS) 2-3V RIGHT COMPARISON:  None. FINDINGS: Complex and comminuted intratrochanteric fracture of the right hip with mild displacement and superior subluxation. Femoral head remains normally position within the acetabulum. Bony pelvis intact. No acute abnormality about the left hip. Diffuse osteopenia noted. Soft tissue swelling overlies the right hip. IMPRESSION: Acute comminuted intertrochanteric fracture of the right hip. Electronically Signed   By: Rise MuBenjamin  McClintock M.D.   On: 06/30/2018 00:53    Procedures Procedures (including critical care time)  Medications Ordered in ED Medications  amLODipine (NORVASC) tablet 2.5 mg (has no administration in time range)  busPIRone (BUSPAR) tablet 5 mg (has no administration in time range)  FLUoxetine (PROZAC) capsule 40 mg (has no administration in time range)  memantine (NAMENDA XR) 24 hr capsule 28 mg (has no administration in time range)  calcitRIOL (ROCALTROL) capsule 0.25 mcg (has no administration in  time range)  levothyroxine (SYNTHROID, LEVOTHROID) tablet 50 mcg (has no administration in time range)  docusate sodium (COLACE) capsule 100 mg (has no administration in time range)  polyethylene glycol (MIRALAX / GLYCOLAX) packet 17 g (has no administration in time range)  UTI-STAT LIQD 30 mL (has no administration in time range)  levETIRAcetam (KEPPRA) tablet 500 mg (has no administration in time range)  dorzolamide-timolol (COSOPT) 22.3-6.8 MG/ML ophthalmic solution 1 drop (1 drop Both Eyes Not Given 06/30/18 0234)  morphine 2 MG/ML injection 0.5 mg (has no administration in time range)  fentaNYL (SUBLIMAZE) injection 50 mcg (50 mcg Intravenous Given 06/30/18 0044)     Initial Impression / Assessment and Plan / ED Course  I have reviewed the triage vital signs and the nursing notes.  Pertinent labs & imaging results that were available during my care of the patient were reviewed by me and considered in my medical decision making (see chart for details).  [age] year old female here after witnessed fall today at nursing facility.  She stood up after using the bathroom and fell onto right hip.  She did strike her head against the toilet but no loss of consciousness reported.  Patient has been at her baseline since fall but has been complaining of right hip pain.  She did have films at outside facility which revealed right hip fracture.  She also had x-rays of the chest and shoulder that were normal.  On exam here she does have some right leg shortening with external rotation as well as some bruising of the left lower leg.  Also has some bruising of the right hand, daughter thinks this is old from a fall a few days ago.  Does have a hematoma to the right side of her head but no open wounds or lacerations.  She takes daily aspirin, no other anticoagulation.  Unfortunately, outside images not able to be viewed here.  Will repeat films of hip and chest, obtain films of left lower leg and right hand as well as  CT head/cervical spine given reported trauma.  Labs pending.  X-ray confirms right comminuted intertrochanteric fracture.  Remainder of imaging is negative for any acute traumatic findings.  Labs are overall reassuring.  Discussed with patient's daughter who is at bedside her thoughts on undergoing surgery for hip at patient's age.  She wanted to discuss with her brother Raiford NobleRick who is patient's power of attorney.  12:52 AM Discussed imaging with patient's POA, her son Raiford NobleRick who is a physician.  States he would like to talk to orthopedist in the morning at hospital to see what operative vs non-operative options are and decide from there.  Discussed with on call orthopedics, Dr. Roda ShuttersXu-- will consult in the morning and discuss options with family.  Discussed with Dr. Toniann FailKakrakandy-- will admit for ongoing care.  Final Clinical Impressions(s) / ED Diagnoses   Final diagnoses:  Closed fracture of right hip, initial encounter [facility](HCC)    ED Discharge Orders    None       Garlon HatchetSanders, Maxxwell Edgett M, PA-C 06/30/18 0355    Shaune PollackIsaacs, [place], MD 06/30/18 [phone]

## 2018-06-30 ENCOUNTER — Other Ambulatory Visit (HOSPITAL_COMMUNITY): Payer: Self-pay

## 2018-06-30 ENCOUNTER — Encounter (HOSPITAL_COMMUNITY)
Admission: EM | Disposition: A | Discharge status: Skilled Nursing Facility | Payer: Self-pay | Source: Skilled Nursing Facility | Attending: Internal Medicine

## 2018-06-30 ENCOUNTER — Inpatient Hospital Stay (HOSPITAL_COMMUNITY): Payer: Medicare Other | Admitting: Certified Registered Nurse Anesthetist

## 2018-06-30 ENCOUNTER — Inpatient Hospital Stay (HOSPITAL_COMMUNITY): Payer: Medicare Other

## 2018-06-30 ENCOUNTER — Encounter (HOSPITAL_COMMUNITY): Payer: Self-pay | Admitting: Internal Medicine

## 2018-06-30 ENCOUNTER — Emergency Department (HOSPITAL_COMMUNITY): Payer: Medicare Other

## 2018-06-30 DIAGNOSIS — S72001A Fracture of unspecified part of neck of right femur, initial encounter for closed fracture: Secondary | ICD-10-CM | POA: Diagnosis present

## 2018-06-30 DIAGNOSIS — E039 Hypothyroidism, unspecified: Secondary | ICD-10-CM | POA: Diagnosis present

## 2018-06-30 DIAGNOSIS — Y92121 Bathroom in nursing home as the place of occurrence of the external cause: Secondary | ICD-10-CM | POA: Diagnosis not present

## 2018-06-30 DIAGNOSIS — Z9181 History of falling: Secondary | ICD-10-CM | POA: Diagnosis not present

## 2018-06-30 DIAGNOSIS — S60221A Contusion of right hand, initial encounter: Secondary | ICD-10-CM | POA: Diagnosis present

## 2018-06-30 DIAGNOSIS — I10 Essential (primary) hypertension: Secondary | ICD-10-CM | POA: Diagnosis present

## 2018-06-30 DIAGNOSIS — E43 Unspecified severe protein-calorie malnutrition: Secondary | ICD-10-CM | POA: Diagnosis present

## 2018-06-30 DIAGNOSIS — D649 Anemia, unspecified: Secondary | ICD-10-CM

## 2018-06-30 DIAGNOSIS — S72141A Displaced intertrochanteric fracture of right femur, initial encounter for closed fracture: Secondary | ICD-10-CM | POA: Diagnosis present

## 2018-06-30 DIAGNOSIS — R569 Unspecified convulsions: Secondary | ICD-10-CM

## 2018-06-30 DIAGNOSIS — Z800 Family history of malignant neoplasm of digestive organs: Secondary | ICD-10-CM | POA: Diagnosis not present

## 2018-06-30 DIAGNOSIS — S0003XA Contusion of scalp, initial encounter: Secondary | ICD-10-CM | POA: Diagnosis present

## 2018-06-30 DIAGNOSIS — D62 Acute posthemorrhagic anemia: Secondary | ICD-10-CM | POA: Diagnosis not present

## 2018-06-30 DIAGNOSIS — D509 Iron deficiency anemia, unspecified: Secondary | ICD-10-CM | POA: Diagnosis present

## 2018-06-30 DIAGNOSIS — Z66 Do not resuscitate: Secondary | ICD-10-CM | POA: Diagnosis present

## 2018-06-30 DIAGNOSIS — W01198A Fall on same level from slipping, tripping and stumbling with subsequent striking against other object, initial encounter: Secondary | ICD-10-CM | POA: Diagnosis present

## 2018-06-30 DIAGNOSIS — S72009A Fracture of unspecified part of neck of unspecified femur, initial encounter for closed fracture: Secondary | ICD-10-CM | POA: Diagnosis present

## 2018-06-30 DIAGNOSIS — G40909 Epilepsy, unspecified, not intractable, without status epilepticus: Secondary | ICD-10-CM | POA: Diagnosis present

## 2018-06-30 DIAGNOSIS — Z7982 Long term (current) use of aspirin: Secondary | ICD-10-CM | POA: Diagnosis not present

## 2018-06-30 DIAGNOSIS — F0390 Unspecified dementia without behavioral disturbance: Secondary | ICD-10-CM | POA: Diagnosis present

## 2018-06-30 DIAGNOSIS — H409 Unspecified glaucoma: Secondary | ICD-10-CM | POA: Diagnosis present

## 2018-06-30 HISTORY — PX: INTRAMEDULLARY (IM) NAIL INTERTROCHANTERIC: SHX5875

## 2018-06-30 LAB — CBC WITH DIFFERENTIAL/PLATELET
ABS IMMATURE GRANULOCYTES: 0.07 10*3/uL (ref 0.00–0.07)
Abs Immature Granulocytes: 0.03 10*3/uL (ref 0.00–0.07)
BASOS PCT: 0 %
BASOS PCT: 0 %
Basophils Absolute: 0 10*3/uL (ref 0.0–0.1)
Basophils Absolute: 0 10*3/uL (ref 0.0–0.1)
EOS PCT: 0 %
Eosinophils Absolute: 0 10*3/uL (ref 0.0–0.5)
Eosinophils Absolute: 0 10*3/uL (ref 0.0–0.5)
Eosinophils Relative: 0 %
HCT: 28.9 % — ABNORMAL LOW (ref 36.0–46.0)
HCT: 30.5 % — ABNORMAL LOW (ref 36.0–46.0)
Hemoglobin: 8.6 g/dL — ABNORMAL LOW (ref 12.0–15.0)
Hemoglobin: 8.8 g/dL — ABNORMAL LOW (ref 12.0–15.0)
Immature Granulocytes: 0 %
Immature Granulocytes: 1 %
Lymphocytes Relative: 4 %
Lymphocytes Relative: 7 %
Lymphs Abs: 0.5 10*3/uL — ABNORMAL LOW (ref 0.7–4.0)
Lymphs Abs: 0.7 10*3/uL (ref 0.7–4.0)
MCH: 25.4 pg — AB (ref 26.0–34.0)
MCH: 26.3 pg (ref 26.0–34.0)
MCHC: 28.9 g/dL — ABNORMAL LOW (ref 30.0–36.0)
MCHC: 29.8 g/dL — ABNORMAL LOW (ref 30.0–36.0)
MCV: 88.2 fL (ref 80.0–100.0)
MCV: 88.4 fL (ref 80.0–100.0)
MONO ABS: 0.7 10*3/uL (ref 0.1–1.0)
MONOS PCT: 6 %
Monocytes Absolute: 0.7 10*3/uL (ref 0.1–1.0)
Monocytes Relative: 8 %
NEUTROS ABS: 10.6 10*3/uL — AB (ref 1.7–7.7)
Neutro Abs: 8.1 10*3/uL — ABNORMAL HIGH (ref 1.7–7.7)
Neutrophils Relative %: 85 %
Neutrophils Relative %: 89 %
PLATELETS: 222 10*3/uL (ref 150–400)
PLATELETS: 226 10*3/uL (ref 150–400)
RBC: 3.27 MIL/uL — ABNORMAL LOW (ref 3.87–5.11)
RBC: 3.46 MIL/uL — ABNORMAL LOW (ref 3.87–5.11)
RDW: 16 % — ABNORMAL HIGH (ref 11.5–15.5)
RDW: 16 % — ABNORMAL HIGH (ref 11.5–15.5)
WBC: 11.9 10*3/uL — ABNORMAL HIGH (ref 4.0–10.5)
WBC: 9.6 10*3/uL (ref 4.0–10.5)
nRBC: 0 % (ref 0.0–0.2)
nRBC: 0.2 % (ref 0.0–0.2)

## 2018-06-30 LAB — FOLATE: Folate: 16.9 ng/mL (ref >5.9)

## 2018-06-30 LAB — BASIC METABOLIC PANEL
Anion gap: 10 (ref 5–15)
Anion gap: 11 (ref 5–15)
BUN: 18 mg/dL (ref 8–23)
BUN: 21 mg/dL (ref 8–23)
CALCIUM: 8.6 mg/dL — AB (ref 8.9–10.3)
CO2: 22 mmol/L (ref 22–32)
CO2: 24 mmol/L (ref 22–32)
CREATININE: 0.74 mg/dL (ref 0.44–1.00)
Calcium: 8.5 mg/dL — ABNORMAL LOW (ref 8.9–10.3)
Chloride: 102 mmol/L (ref 98–111)
Chloride: 105 mmol/L (ref 98–111)
Creatinine, Ser: 0.78 mg/dL (ref 0.44–1.00)
GFR calc Af Amer: >60 mL/min (ref >60)
GFR calc Af Amer: >60 mL/min (ref >60)
GLUCOSE: 136 mg/dL — AB (ref 70–99)
Glucose, Bld: 119 mg/dL — ABNORMAL HIGH (ref 70–99)
POTASSIUM: 3.5 mmol/L (ref 3.5–5.1)
Potassium: 3.5 mmol/L (ref 3.5–5.1)
Sodium: 137 mmol/L (ref 135–145)
Sodium: 137 mmol/L (ref 135–145)

## 2018-06-30 LAB — IRON AND TIBC
Iron: 26 ug/dL — ABNORMAL LOW (ref 28–170)
Saturation Ratios: 7 % — ABNORMAL LOW (ref 10.4–31.8)
TIBC: 361 ug/dL (ref 250–450)
UIBC: 335 ug/dL

## 2018-06-30 LAB — RETICULOCYTES
Immature Retic Fract: 23.1 % — ABNORMAL HIGH (ref 2.3–15.9)
RBC.: 3.27 MIL/uL — ABNORMAL LOW (ref 3.87–5.11)
Retic Count, Absolute: 73.9 10*3/uL (ref 19.0–186.0)
Retic Ct Pct: 2.3 % (ref 0.4–3.1)

## 2018-06-30 LAB — VITAMIN B12: Vitamin B-12: 402 pg/mL (ref 180–914)

## 2018-06-30 LAB — PROTIME-INR
INR: 1.02
Prothrombin Time: 13.3 seconds (ref 11.4–15.2)

## 2018-06-30 LAB — FERRITIN: Ferritin: 36 ng/mL (ref 11–307)

## 2018-06-30 LAB — ABO/RH: ABO/RH(D): O POS

## 2018-06-30 SURGERY — FIXATION, FRACTURE, INTERTROCHANTERIC, WITH INTRAMEDULLARY ROD
Anesthesia: General | Site: Leg Upper | Laterality: Right

## 2018-06-30 MED ORDER — LEVETIRACETAM 500 MG PO TABS: 500.0000 mg | ORAL_TABLET | Freq: Two times a day (BID) | ORAL | Status: DC

## 2018-06-30 MED ORDER — ENOXAPARIN SODIUM 40 MG/0.4ML ~~LOC~~ SOLN: 40.0000 mg | Freq: Every day | SUBCUTANEOUS | 0 refills | Status: AC

## 2018-06-30 MED ORDER — MENTHOL 3 MG MT LOZG: 1.0000 | LOZENGE | OROMUCOSAL | Status: DC | PRN

## 2018-06-30 MED ORDER — POTASSIUM CHLORIDE CRYS ER 20 MEQ PO TBCR
40.0000 meq | EXTENDED_RELEASE_TABLET | Freq: Once | ORAL | Status: AC
  Administered 2018-07-01: 40 meq via ORAL
  Filled 2018-06-30: qty 2

## 2018-06-30 MED ORDER — MEMANTINE HCL ER 28 MG PO CP24
28.0000 mg | ORAL_CAPSULE | Freq: Every day | ORAL | Status: DC
  Administered 2018-07-01 – 2018-07-03 (×3): 28 mg via ORAL
  Filled 2018-06-30 (×4): qty 1

## 2018-06-30 MED ORDER — ACETAMINOPHEN 325 MG PO TABS: 325.0000 mg | ORAL_TABLET | Freq: Four times a day (QID) | ORAL | Status: DC | PRN

## 2018-06-30 MED ORDER — SUGAMMADEX SODIUM 200 MG/2ML IV SOLN
INTRAVENOUS | Status: DC | PRN
  Administered 2018-06-30: 120 mg via INTRAVENOUS

## 2018-06-30 MED ORDER — AMLODIPINE BESYLATE 5 MG PO TABS: 2.5000 mg | ORAL_TABLET | Freq: Every day | ORAL | Status: DC

## 2018-06-30 MED ORDER — FENTANYL CITRATE (PF) 250 MCG/5ML IJ SOLN
INTRAMUSCULAR | Status: AC
  Filled 2018-06-30: qty 5

## 2018-06-30 MED ORDER — FLUOXETINE HCL 20 MG PO CAPS: 20.0000 mg | ORAL_CAPSULE | Freq: Every day | ORAL | Status: DC

## 2018-06-30 MED ORDER — BUSPIRONE HCL 10 MG PO TABS: 5.0000 mg | ORAL_TABLET | Freq: Two times a day (BID) | ORAL | Status: DC

## 2018-06-30 MED ORDER — SODIUM CHLORIDE 0.9 % IV SOLN
INTRAVENOUS | Status: DC | PRN
  Administered 2018-06-30: 20 ug/min via INTRAVENOUS

## 2018-06-30 MED ORDER — HYDROCODONE-ACETAMINOPHEN 5-325 MG PO TABS
1.0000 | ORAL_TABLET | ORAL | Status: DC | PRN
  Administered 2018-07-01 (×2): 1 via ORAL
  Filled 2018-06-30 (×2): qty 1

## 2018-06-30 MED ORDER — ENOXAPARIN SODIUM 40 MG/0.4ML ~~LOC~~ SOLN
40.0000 mg | SUBCUTANEOUS | Status: DC
  Administered 2018-07-01 – 2018-07-03 (×3): 40 mg via SUBCUTANEOUS
  Filled 2018-06-30 (×3): qty 0.4

## 2018-06-30 MED ORDER — PROPOFOL 10 MG/ML IV BOLUS
INTRAVENOUS | Status: DC | PRN
  Administered 2018-06-30: 50 mg via INTRAVENOUS
  Administered 2018-06-30: 20 mg via INTRAVENOUS
  Administered 2018-06-30: 10 mg via INTRAVENOUS

## 2018-06-30 MED ORDER — LEVOTHYROXINE SODIUM 50 MCG PO TABS: 50.0000 ug | ORAL_TABLET | Freq: Every day | ORAL | Status: DC

## 2018-06-30 MED ORDER — SODIUM CHLORIDE 0.9 % IV SOLN
INTRAVENOUS | Status: DC
  Administered 2018-06-30 – 2018-07-01 (×2): via INTRAVENOUS

## 2018-06-30 MED ORDER — CEFAZOLIN SODIUM-DEXTROSE 2-4 GM/100ML-% IV SOLN
2.0000 g | Freq: Four times a day (QID) | INTRAVENOUS | Status: AC
  Administered 2018-06-30 – 2018-07-01 (×3): 2 g via INTRAVENOUS
  Filled 2018-06-30 (×3): qty 100

## 2018-06-30 MED ORDER — POVIDONE-IODINE 10 % EX SWAB
2.0000 "application " | Freq: Once | CUTANEOUS | Status: AC
  Administered 2018-06-30: 2 via TOPICAL

## 2018-06-30 MED ORDER — TRANEXAMIC ACID-NACL 1000-0.7 MG/100ML-% IV SOLN
1000.0000 mg | INTRAVENOUS | Status: AC
  Administered 2018-06-30: 1000 mg via INTRAVENOUS
  Filled 2018-06-30: qty 100

## 2018-06-30 MED ORDER — LIDOCAINE 2% (20 MG/ML) 5 ML SYRINGE
INTRAMUSCULAR | Status: DC | PRN
  Administered 2018-06-30: 40 mg via INTRAVENOUS

## 2018-06-30 MED ORDER — CALCITRIOL 0.25 MCG PO CAPS
0.2500 ug | ORAL_CAPSULE | Freq: Every day | ORAL | Status: DC
  Filled 2018-06-30: qty 1

## 2018-06-30 MED ORDER — FLUOXETINE HCL 20 MG PO CAPS
20.0000 mg | ORAL_CAPSULE | Freq: Every day | ORAL | Status: DC
  Administered 2018-07-01 – 2018-07-03 (×3): 20 mg via ORAL
  Filled 2018-06-30 (×4): qty 1

## 2018-06-30 MED ORDER — MEMANTINE HCL ER 28 MG PO CP24
28.0000 mg | ORAL_CAPSULE | Freq: Every day | ORAL | Status: DC
  Filled 2018-06-30: qty 1

## 2018-06-30 MED ORDER — FLUOXETINE HCL 20 MG PO CAPS
40.0000 mg | ORAL_CAPSULE | Freq: Every day | ORAL | Status: DC
  Filled 2018-06-30: qty 2

## 2018-06-30 MED ORDER — ALBUMIN HUMAN 5 % IV SOLN
INTRAVENOUS | Status: DC | PRN
  Administered 2018-06-30: 18:00:00 via INTRAVENOUS

## 2018-06-30 MED ORDER — ONDANSETRON HCL 4 MG/2ML IJ SOLN: 4.0000 mg | Freq: Four times a day (QID) | INTRAMUSCULAR | Status: DC | PRN

## 2018-06-30 MED ORDER — ONDANSETRON HCL 4 MG/2ML IJ SOLN
INTRAMUSCULAR | Status: DC | PRN
  Administered 2018-06-30: 2 mg via INTRAVENOUS

## 2018-06-30 MED ORDER — PHENOL 1.4 % MT LIQD: 1.0000 | OROMUCOSAL | Status: DC | PRN

## 2018-06-30 MED ORDER — ROCURONIUM BROMIDE 50 MG/5ML IV SOSY
PREFILLED_SYRINGE | INTRAVENOUS | Status: DC | PRN
  Administered 2018-06-30: 10 mg via INTRAVENOUS
  Administered 2018-06-30: 15 mg via INTRAVENOUS

## 2018-06-30 MED ORDER — DOCUSATE SODIUM 100 MG PO CAPS: 100.0000 mg | ORAL_CAPSULE | Freq: Two times a day (BID) | ORAL | Status: DC

## 2018-06-30 MED ORDER — MORPHINE SULFATE (PF) 2 MG/ML IV SOLN
1.0000 mg | INTRAVENOUS | Status: DC | PRN
  Administered 2018-07-01 – 2018-07-02 (×4): 1 mg via INTRAVENOUS
  Filled 2018-06-30 (×6): qty 1

## 2018-06-30 MED ORDER — ACETAMINOPHEN 500 MG PO TABS
500.0000 mg | ORAL_TABLET | Freq: Four times a day (QID) | ORAL | Status: AC
  Filled 2018-06-30: qty 1

## 2018-06-30 MED ORDER — DORZOLAMIDE HCL-TIMOLOL MAL 2-0.5 % OP SOLN
1.0000 [drp] | Freq: Two times a day (BID) | OPHTHALMIC | Status: DC
  Filled 2018-06-30: qty 10

## 2018-06-30 MED ORDER — CEFAZOLIN SODIUM-DEXTROSE 2-4 GM/100ML-% IV SOLN
2.0000 g | INTRAVENOUS | Status: AC
  Administered 2018-06-30: 2 g via INTRAVENOUS
  Filled 2018-06-30: qty 100

## 2018-06-30 MED ORDER — ALUM & MAG HYDROXIDE-SIMETH 200-200-20 MG/5ML PO SUSP: 30.0000 mL | ORAL | Status: DC | PRN

## 2018-06-30 MED ORDER — LEVETIRACETAM 500 MG PO TABS
500.0000 mg | ORAL_TABLET | Freq: Two times a day (BID) | ORAL | Status: DC
  Administered 2018-07-01 – 2018-07-03 (×5): 500 mg via ORAL
  Filled 2018-06-30 (×6): qty 1

## 2018-06-30 MED ORDER — METHOCARBAMOL 1000 MG/10ML IJ SOLN
500.0000 mg | Freq: Four times a day (QID) | INTRAVENOUS | Status: DC | PRN
  Administered 2018-07-01 – 2018-07-02 (×2): 500 mg via INTRAVENOUS
  Filled 2018-06-30: qty 500
  Filled 2018-06-30 (×2): qty 5

## 2018-06-30 MED ORDER — FENTANYL CITRATE (PF) 100 MCG/2ML IJ SOLN: 25.0000 ug | INTRAMUSCULAR | Status: DC | PRN

## 2018-06-30 MED ORDER — AMLODIPINE BESYLATE 5 MG PO TABS
5.0000 mg | ORAL_TABLET | Freq: Every day | ORAL | Status: DC
  Administered 2018-07-01 – 2018-07-02 (×2): 5 mg via ORAL
  Filled 2018-06-30 (×2): qty 1

## 2018-06-30 MED ORDER — TRANEXAMIC ACID 1000 MG/10ML IV SOLN
2000.0000 mg | INTRAVENOUS | Status: AC
  Administered 2018-06-30: 2000 mg via TOPICAL
  Filled 2018-06-30: qty 20

## 2018-06-30 MED ORDER — SORBITOL 70 % SOLN: 30.0000 mL | Freq: Every day | Status: DC | PRN

## 2018-06-30 MED ORDER — DOCUSATE SODIUM 100 MG PO CAPS
100.0000 mg | ORAL_CAPSULE | Freq: Two times a day (BID) | ORAL | Status: DC
  Administered 2018-07-01 – 2018-07-03 (×4): 100 mg via ORAL
  Filled 2018-06-30 (×6): qty 1

## 2018-06-30 MED ORDER — DOCUSATE SODIUM 100 MG PO CAPS: 100.0000 mg | ORAL_CAPSULE | Freq: Every day | ORAL | Status: DC

## 2018-06-30 MED ORDER — TRANEXAMIC ACID 1000 MG/10ML IV SOLN
2000.0000 mg | INTRAVENOUS | Status: DC
  Filled 2018-06-30: qty 20

## 2018-06-30 MED ORDER — BUSPIRONE HCL 5 MG PO TABS
10.0000 mg | ORAL_TABLET | Freq: Two times a day (BID) | ORAL | Status: DC
  Administered 2018-07-01 – 2018-07-03 (×5): 10 mg via ORAL
  Filled 2018-06-30 (×6): qty 2

## 2018-06-30 MED ORDER — METHOCARBAMOL 500 MG PO TABS
500.0000 mg | ORAL_TABLET | Freq: Four times a day (QID) | ORAL | Status: DC | PRN
  Administered 2018-07-01 – 2018-07-03 (×3): 500 mg via ORAL
  Filled 2018-06-30 (×4): qty 1

## 2018-06-30 MED ORDER — LEVOTHYROXINE SODIUM 50 MCG PO TABS
50.0000 ug | ORAL_TABLET | Freq: Every day | ORAL | Status: DC
  Administered 2018-07-03: 50 ug via ORAL
  Filled 2018-06-30 (×2): qty 1

## 2018-06-30 MED ORDER — MORPHINE SULFATE (PF) 2 MG/ML IV SOLN
0.5000 mg | INTRAVENOUS | Status: DC | PRN
  Administered 2018-06-30 (×3): 0.5 mg via INTRAVENOUS
  Filled 2018-06-30 (×3): qty 1

## 2018-06-30 MED ORDER — HYDROCODONE-ACETAMINOPHEN 7.5-325 MG PO TABS
1.0000 | ORAL_TABLET | ORAL | Status: DC | PRN
  Administered 2018-07-02: 1 via ORAL
  Administered 2018-07-02: 2 via ORAL
  Administered 2018-07-03 (×3): 1 via ORAL
  Filled 2018-06-30 (×3): qty 1
  Filled 2018-06-30: qty 2
  Filled 2018-06-30: qty 1

## 2018-06-30 MED ORDER — UTI-STAT PO LIQD: 30.0000 mL | Freq: Every day | ORAL | Status: DC

## 2018-06-30 MED ORDER — OXYCODONE-ACETAMINOPHEN 5-325 MG PO TABS: 1.0000 | ORAL_TABLET | ORAL | 0 refills | Status: DC | PRN

## 2018-06-30 MED ORDER — FENTANYL CITRATE (PF) 100 MCG/2ML IJ SOLN
50.0000 ug | Freq: Once | INTRAMUSCULAR | Status: AC
  Administered 2018-06-30: 50 ug via INTRAVENOUS
  Filled 2018-06-30: qty 2

## 2018-06-30 MED ORDER — LACTATED RINGERS IV SOLN
INTRAVENOUS | Status: DC
  Administered 2018-06-30 – 2018-07-03 (×3): via INTRAVENOUS

## 2018-06-30 MED ORDER — POLYETHYLENE GLYCOL 3350 17 G PO PACK: 17.0000 g | PACK | Freq: Every day | ORAL | Status: DC

## 2018-06-30 MED ORDER — ONDANSETRON HCL 4 MG PO TABS: 4.0000 mg | ORAL_TABLET | Freq: Four times a day (QID) | ORAL | Status: DC | PRN

## 2018-06-30 MED ORDER — MAGNESIUM CITRATE PO SOLN: 1.0000 | Freq: Once | ORAL | Status: DC | PRN

## 2018-06-30 MED ORDER — 0.9 % SODIUM CHLORIDE (POUR BTL) OPTIME
TOPICAL | Status: DC | PRN
  Administered 2018-06-30: 1000 mL

## 2018-06-30 MED ORDER — DORZOLAMIDE HCL-TIMOLOL MAL 2-0.5 % OP SOLN
1.0000 [drp] | Freq: Two times a day (BID) | OPHTHALMIC | Status: DC
  Administered 2018-06-30 – 2018-07-03 (×6): 1 [drp] via OPHTHALMIC
  Filled 2018-06-30 (×2): qty 10

## 2018-06-30 MED ORDER — FENTANYL CITRATE (PF) 100 MCG/2ML IJ SOLN
INTRAMUSCULAR | Status: DC | PRN
  Administered 2018-06-30 (×4): 12.5 ug via INTRAVENOUS

## 2018-06-30 MED ORDER — POLYETHYLENE GLYCOL 3350 17 G PO PACK
17.0000 g | PACK | Freq: Every day | ORAL | Status: DC | PRN
  Administered 2018-07-03: 17 g via ORAL
  Filled 2018-06-30: qty 1

## 2018-06-30 MED ORDER — DEXAMETHASONE SODIUM PHOSPHATE 10 MG/ML IJ SOLN
INTRAMUSCULAR | Status: DC | PRN
  Administered 2018-06-30: 4 mg via INTRAVENOUS

## 2018-06-30 SURGICAL SUPPLY — 45 items
BIT DRILL SHORT 4.0 (BIT) IMPLANT
BNDG COHESIVE 4X5 TAN NS LF (GAUZE/BANDAGES/DRESSINGS) ×1 IMPLANT
BNDG COHESIVE 6X5 TAN STRL LF (GAUZE/BANDAGES/DRESSINGS) IMPLANT
BNDG GAUZE ELAST 4 BULKY (GAUZE/BANDAGES/DRESSINGS) ×1 IMPLANT
COVER PERINEAL POST (MISCELLANEOUS) ×3 IMPLANT
COVER SURGICAL LIGHT HANDLE (MISCELLANEOUS) ×3 IMPLANT
COVER WAND RF STERILE (DRAPES) ×3 IMPLANT
DRAPE C-ARMOR (DRAPES) ×2 IMPLANT
DRAPE STERI IOBAN 125X83 (DRAPES) ×3 IMPLANT
DRILL BIT SHORT 4.0 (BIT) ×2
DRSG MEPILEX BORDER 4X4 (GAUZE/BANDAGES/DRESSINGS) ×9 IMPLANT
DRSG PAD ABDOMINAL 8X10 ST (GAUZE/BANDAGES/DRESSINGS) ×2 IMPLANT
DURAPREP 26ML APPLICATOR (WOUND CARE) ×3 IMPLANT
ELECT REM PT RETURN 9FT ADLT (ELECTROSURGICAL) ×3
ELECTRODE REM PT RTRN 9FT ADLT (ELECTROSURGICAL) ×1 IMPLANT
GAUZE XEROFORM 5X9 LF (GAUZE/BANDAGES/DRESSINGS) ×2 IMPLANT
GLOVE BIOGEL PI IND STRL 7.0 (GLOVE) ×1 IMPLANT
GLOVE BIOGEL PI INDICATOR 7.0 (GLOVE) ×2
GLOVE ECLIPSE 7.0 STRL STRAW (GLOVE) ×3 IMPLANT
GLOVE SKINSENSE NS SZ7.5 (GLOVE) ×4
GLOVE SKINSENSE STRL SZ7.5 (GLOVE) ×2 IMPLANT
GOWN STRL REIN XL XLG (GOWN DISPOSABLE) ×3 IMPLANT
GUIDE PIN 3.2X343 (PIN) ×2
GUIDE PIN 3.2X343MM (PIN) ×4
GUIDE ROD 3.0 (MISCELLANEOUS) ×3
KIT BASIN OR (CUSTOM PROCEDURE TRAY) ×3 IMPLANT
KIT TURNOVER KIT B (KITS) ×3 IMPLANT
MANIFOLD NEPTUNE II (INSTRUMENTS) ×3 IMPLANT
NAIL FEM 1.5 125D RT 11.5X36 (Nail) ×2 IMPLANT
NS IRRIG 1000ML POUR BTL (IV SOLUTION) ×3 IMPLANT
PACK GENERAL/GYN (CUSTOM PROCEDURE TRAY) ×3 IMPLANT
PAD ARMBOARD 7.5X6 YLW CONV (MISCELLANEOUS) ×6 IMPLANT
PAD CAST 4YDX4 CTTN HI CHSV (CAST SUPPLIES) ×2 IMPLANT
PADDING CAST COTTON 4X4 STRL (CAST SUPPLIES)
PIN GUIDE 3.2X343MM (PIN) IMPLANT
ROD GUIDE 3.0 (MISCELLANEOUS) IMPLANT
SCREW LAG COMPR KIT 85/80 (Screw) ×2 IMPLANT
SCREW TRIGEN LOW PROF 5.0X47.5 (Screw) ×2 IMPLANT
STAPLER VISISTAT 35W (STAPLE) ×3 IMPLANT
SUT VIC AB 0 CT1 27 (SUTURE) ×2
SUT VIC AB 0 CT1 27XBRD ANBCTR (SUTURE) ×1 IMPLANT
SUT VIC AB 2-0 CT1 27 (SUTURE) ×2
SUT VIC AB 2-0 CT1 TAPERPNT 27 (SUTURE) ×1 IMPLANT
TOWEL GREEN STERILE (TOWEL DISPOSABLE) ×2 IMPLANT
WATER STERILE IRR 1000ML POUR (IV SOLUTION) ×3 IMPLANT

## 2018-06-30 NOTE — ED Notes (Signed)
Placed pulse ox on ear, as pt removes it from finger.

## 2018-06-30 NOTE — ED Notes (Signed)
Pt placed on hospital bed and purewick

## 2018-06-30 NOTE — Op Note (Signed)
   Date of Surgery: 06/30/2018  INDICATIONS: Rachel Barry is a 85 y.o.-year-old female who sustained a right hip fracture. The risks and benefits of the procedure discussed with the family prior to the procedure and all questions were answered; consent was obtained.  PREOPERATIVE DIAGNOSIS: right intertrochanteric hip fracture   POSTOPERATIVE DIAGNOSIS: Same   PROCEDURE: Treatment of intertrochanteric fracture with intramedullary implant. CPT [phone]   SURGEON: N. Glee Arvin, M.D.   ASSIST: Starlyn Skeans [place], [place]; necessary for the timely completion of procedure and due to complexity of procedure.  ANESTHESIA: general   IV FLUIDS AND URINE: See anesthesia record   ESTIMATED BLOOD LOSS: 200 cc  IMPLANTS: [employer] InterTAN 11.5 x 36, 85/80 lag screws  DRAINS: None.   COMPLICATIONS: None.   DESCRIPTION OF PROCEDURE: The patient was brought to the operating room and placed supine on the operating table. The patient's leg had been signed prior to the procedure. The patient had the anesthesia placed by the anesthesiologist. The prep verification and incision time-outs were performed to confirm that this was the correct patient, site, side and location. The patient had an SCD on the opposite lower extremity. The patient did receive antibiotics prior to the incision and was re-dosed during the procedure as needed at indicated intervals. The patient was positioned on the fracture table with the table in traction and internal rotation to reduce the hip. The well leg was placed in a scissor position and all bony prominences were well-padded. The patient had the lower extremity prepped and draped in the standard surgical fashion. The incision was made 4 finger breadths superior to the greater trochanter. A guide pin was inserted into the tip of the greater trochanter under fluoroscopic guidance. An opening reamer was used to gain access to the femoral canal. The nail length was measured and  inserted down the femoral canal to its proper depth. The appropriate version of insertion for the lag screw was found under fluoroscopy. A pin was inserted up the femoral neck through the jig. Then, a second antirotation pin was inserted inferior to the first pin. The length of the lag screw was then measured. The lag screw was inserted as near to center-center in the head as possible. The antirotation pin was then taken out and an interdigitating compression screw was placed in its place. The leg was taken out of traction, then the interdigitating compression screw was used to compress across the fracture. Compression was visualized on serial xrays. A single distal interlocking screw was placed using the perfect circle technique.  The wound was copiously irrigated with saline and the subcutaneous layer closed with 2.0 vicryl and the skin was reapproximated with staples. The wounds were cleaned and dried a final time and a sterile dressing was placed. The hip was taken through a range of motion at the end of the case under fluoroscopic imaging to visualize the approach-withdraw phenomenon and confirm implant length in the head. The patient was then awakened from anesthesia and taken to the recovery room in stable condition. All counts were correct at the end of the case.   POSTOPERATIVE PLAN: The patient will be weight bearing as tolerated and will return in 2 weeks for staple removal and the patient will receive DVT prophylaxis based on other medications, activity level, and risk ratio of bleeding to thrombosis.   Rachel Reel, MD [facility] [phone] 6:59 PM

## 2018-06-30 NOTE — Anesthesia Procedure Notes (Signed)
Procedure Name: Intubation Date/Time: 06/30/2018 6:01 PM Performed by: Adalberto Ill, CRNA Pre-anesthesia Checklist: Patient identified, Emergency Drugs available, Suction available, Patient being monitored and Timeout performed Patient Re-evaluated:Patient Re-evaluated prior to induction Oxygen Delivery Method: Circle system utilized Preoxygenation: Pre-oxygenation with 100% oxygen Induction Type: IV induction Ventilation: Mask ventilation without difficulty Laryngoscope Size: Mac and 3 Grade View: Grade I Tube type: Oral Tube size: 7.0 mm Number of attempts: 1 (brief atraumatic ) Airway Equipment and Method: Stylet Placement Confirmation: ETT inserted through vocal cords under direct vision,  positive ETCO2 and breath sounds checked- equal and bilateral Secured at: 20 cm Tube secured with: Tape Dental Injury: Teeth and Oropharynx as per pre-operative assessment

## 2018-06-30 NOTE — Progress Notes (Signed)
[facility] PROGRESS NOTE  Rachel Barry ZOX:[id number] DOB: [birth year] DOA: 06/29/2018   PCP: System, Pcp Not In     Assessment/Plan: Principal Problem:   Hip fracture, right, closed, initial encounter (HCC) Active Problems:   Seizure (HCC)   Essential hypertension   Normochromic normocytic anemia   Hip fracture (HCC)    85 y.o. female with history of hypertension, seizures, hypothyroidism, dementia, anemia was brought to the ER after patient had a fall at her living facility.  Patient stood up from the commode and missed a balance and fell onto the side and hit her head in the process onto the commode. x-rays of the hip showed right hip fracture. Dr. Roda Shutters orthopedic surgeon has been consulted.  Assessment and plan 1. Right hip fracture status post mechanical fall -given the patient's age and comorbidities patient will be at high risk for intermediate risk procedure.  We will keep patient n.p.o. in anticipation of procedure this afternoon. Patient mortality deemed to   moderate to high risk given age. Son is aware and would like to proceed. DVT prophylaxis as per orthopedic surgery 2. Hypertension on amlodipine. 3. Normocytic normochromic anemia 2 g further drop from recent past.  Follow CBC check anemia panel.  Type and screen. 4. History of seizures on Keppra. 5. Hypothyroidism on Synthroid. 6. Dementia on Namenda. 7. Anxiety on Prozac and BuSpar.     DVT prophylaxsis  TO BE INITIATED AFTER SURGERY BY ORTHOPEDICS   Code Status:  Do not resuscitate    Family Communication: Discussed in detail with the patient, all imaging results, lab results explained to the patient   Disposition Plan:    Surgery this afternoon      Consultants: N. Glee Arvin, MD  Procedures:  none  Antibiotics: Anti-infectives (From admission, onward)   Start     Dose/Rate Route Frequency Ordered Stop   06/30/18 0730  ceFAZolin (ANCEF) IVPB 2g/100 mL premix     2 g 200 mL/hr over  30 Minutes Intravenous On call to O.R. 06/30/18 0728 07/01/18 0559         HPI/Subjective:  currently pain seems to be controlled, otherwise hemodynamically stable, anticipated to have surgery today  Objective: Vitals:   06/30/18 0800 06/30/18 1000 06/30/18 1200 06/30/18 1215  BP: 122/61 (!) 158/72 (!) 142/90   Pulse: 62 81  74  Resp: 14 14  16   Temp:      TempSrc:      SpO2: 99% 99%  99%   No intake or output data in the 24 hours ending 06/30/18 1442  Exam:  Examination:  General exam: Appears calm and comfortable  Respiratory system: Clear to auscultation. Respiratory effort normal. Cardiovascular system: S1 & S2 heard, RRR. No JVD, murmurs, rubs, gallops or clicks. No pedal edema. Gastrointestinal system: Abdomen is nondistended, soft and nontender. No organomegaly or masses felt. Normal bowel sounds heard. Central nervous system: Alert and oriented. No focal neurological deficits. Extremities: Symmetric 5 x 5 power. Skin: No rashes, lesions or ulcers Psychiatry: Judgement and insight appear normal. Mood & affect appropriate.     Data Reviewed: I have personally reviewed following labs and imaging studies  Micro Results No results found for this or any previous visit (from the past 240 hour(s)).  Radiology Reports Dg Chest 1 View  Result Date: 06/30/2018 CLINICAL DATA:  Initial evaluation for acute trauma, hip fracture. EXAM: CHEST  1 VIEW COMPARISON:  Prior radiograph from 07/22/2016. FINDINGS: Cardiomegaly, stable. Mediastinal silhouette  within normal limits. Aortic atherosclerosis. Lungs somewhat hyperinflated with changes related to underlying COPD. No focal infiltrates. No edema or effusion. No appreciable pneumothorax on this supine view the chest. Diffuse osteopenia.  No acute osseous abnormality. IMPRESSION: 1. No active cardiopulmonary disease. 2. Underlying COPD. 3. Stable cardiomegaly without edema. 4. Aortic atherosclerosis. Electronically Signed   By:  Rise MuBenjamin  McClintock M.D.   On: 06/30/2018 00:55   Dg Tibia/fibula Left  Result Date: 06/30/2018 CLINICAL DATA:  Initial evaluation for acute trauma, fall. EXAM: LEFT TIBIA AND FIBULA - 2 VIEW COMPARISON:  None. FINDINGS: No acute fracture or dislocation. Diffuse osteopenia noted. No acute soft tissue abnormality. Vascular calcifications noted within the visualized leg. Degenerative osteoarthrosis with chondrocalcinosis noted at the knee. IMPRESSION: No acute osseous abnormality about the left tibia/fibula. Electronically Signed   By: Rise MuBenjamin  McClintock M.D.   On: 06/30/2018 00:58   Ct Head Wo Contrast  Result Date: 06/30/2018 CLINICAL DATA:  Head trauma, minor, pt on anticoagulation; Head trauma, minor, GCS>=13, high clinical risk, initial exam. One hundred year old post fall this afternoon. EXAM: CT HEAD WITHOUT CONTRAST CT CERVICAL SPINE WITHOUT CONTRAST TECHNIQUE: Multidetector CT imaging of the head and cervical spine was performed following the standard protocol without intravenous contrast. Multiplanar CT image reconstructions of the cervical spine were also generated. COMPARISON:  CT 07/22/2016 FINDINGS: CT HEAD FINDINGS Brain: Unchanged atrophy and chronic small vessel ischemia. No intracranial hemorrhage, mass effect, or midline shift. No hydrocephalus. The basilar cisterns are patent. No evidence of territorial infarct or acute ischemia. No extra-axial or intracranial fluid collection. Vascular: Atherosclerosis of skullbase vasculature without hyperdense vessel or abnormal calcification. Skull: No fracture or focal lesion. Sinuses/Orbits: Postsurgical change of the paranasal sinuses without acute inflammation. Mastoid air cells are clear. No acute orbital abnormality. Other: None. CT CERVICAL SPINE FINDINGS Patient had difficulty tolerating the exam, there is motion artifact. Alignment: No traumatic subluxation. Minimal anterolisthesis of C4 on C5 and C7 on T1, chronic and likely facet mediated.  Skull base and vertebrae: No evidence of fracture, motion limits detailed assessment. The vertebral body heights are preserved. Degenerative change at C1-C2 again seen. Soft tissues and spinal canal: No prevertebral fluid or swelling. No visible canal hematoma. Disc levels: Disc space narrowing and endplate spurring, most prominent at C6-C7, unchanged from prior exam. Multilevel facet arthropathy. Upper chest: Biapical pleuroparenchymal scarring. No acute findings. Other: Carotid calcifications. IMPRESSION: 1. No acute intracranial abnormality. No skull fracture. Stable atrophy and chronic small vessel ischemia. 2. Motion limited evaluation of the cervical spine. No evidence of acute fracture. Degenerative change is grossly stable from 2018. Electronically Signed   By: Narda RutherfordMelanie  Sanford M.D.   On: 06/30/2018 00:36   Ct Cervical Spine Wo Contrast  Result Date: 06/30/2018 CLINICAL DATA:  Head trauma, minor, pt on anticoagulation; Head trauma, minor, GCS>=13, high clinical risk, initial exam. One hundred year old post fall this afternoon. EXAM: CT HEAD WITHOUT CONTRAST CT CERVICAL SPINE WITHOUT CONTRAST TECHNIQUE: Multidetector CT imaging of the head and cervical spine was performed following the standard protocol without intravenous contrast. Multiplanar CT image reconstructions of the cervical spine were also generated. COMPARISON:  CT 07/22/2016 FINDINGS: CT HEAD FINDINGS Brain: Unchanged atrophy and chronic small vessel ischemia. No intracranial hemorrhage, mass effect, or midline shift. No hydrocephalus. The basilar cisterns are patent. No evidence of territorial infarct or acute ischemia. No extra-axial or intracranial fluid collection. Vascular: Atherosclerosis of skullbase vasculature without hyperdense vessel or abnormal calcification. Skull: No fracture or focal lesion.  Sinuses/Orbits: Postsurgical change of the paranasal sinuses without acute inflammation. Mastoid air cells are clear. No acute orbital  abnormality. Other: None. CT CERVICAL SPINE FINDINGS Patient had difficulty tolerating the exam, there is motion artifact. Alignment: No traumatic subluxation. Minimal anterolisthesis of C4 on C5 and C7 on T1, chronic and likely facet mediated. Skull base and vertebrae: No evidence of fracture, motion limits detailed assessment. The vertebral body heights are preserved. Degenerative change at C1-C2 again seen. Soft tissues and spinal canal: No prevertebral fluid or swelling. No visible canal hematoma. Disc levels: Disc space narrowing and endplate spurring, most prominent at C6-C7, unchanged from prior exam. Multilevel facet arthropathy. Upper chest: Biapical pleuroparenchymal scarring. No acute findings. Other: Carotid calcifications. IMPRESSION: 1. No acute intracranial abnormality. No skull fracture. Stable atrophy and chronic small vessel ischemia. 2. Motion limited evaluation of the cervical spine. No evidence of acute fracture. Degenerative change is grossly stable from 2018. Electronically Signed   By: Narda Rutherford M.D.   On: 06/30/2018 00:36   Dg Hand Complete Right  Result Date: 06/30/2018 CLINICAL DATA:  Initial evaluation for acute trauma, fall. EXAM: RIGHT HAND - COMPLETE 3+ VIEW COMPARISON:  None. FINDINGS: No acute fracture or dislocation. Moderate to advanced osteoarthritic changes present throughout the hand, most notable at the right first [facility] joint. Angulation at the right first MCP and IP joints likely chronic and degenerative. Suspected remotely healed fracture of the mid right fifth metacarpal shaft. No acute soft tissue abnormality. Diffuse osteopenia noted. IMPRESSION: No acute osseous abnormality about the right hand. Electronically Signed   By: Rise Mu M.D.   On: 06/30/2018 00:57   Dg Hip Unilat With Pelvis 2-3 Views Right  Result Date: 06/30/2018 CLINICAL DATA:  Initial evaluation for acute trauma, fall. EXAM: DG HIP (WITH OR WITHOUT PELVIS) 2-3V RIGHT COMPARISON:   None. FINDINGS: Complex and comminuted intratrochanteric fracture of the right hip with mild displacement and superior subluxation. Femoral head remains normally position within the acetabulum. Bony pelvis intact. No acute abnormality about the left hip. Diffuse osteopenia noted. Soft tissue swelling overlies the right hip. IMPRESSION: Acute comminuted intertrochanteric fracture of the right hip. Electronically Signed   By: Rise Mu M.D.   On: 06/30/2018 00:53     CBC Recent Labs  Lab 06/29/18 2333 06/30/18 0637  WBC 11.9* 9.6  HGB 8.8* 8.6*  HCT 30.5* 28.9*  PLT 226 222  MCV 88.2 88.4  MCH 25.4* 26.3  MCHC 28.9* 29.8*  RDW 16.0* 16.0*  LYMPHSABS 0.5* 0.7  MONOABS 0.7 0.7  EOSABS 0.0 0.0  BASOSABS 0.0 0.0    Chemistries  Recent Labs  Lab 06/29/18 2333 06/30/18 0637  NA 137 137  K 3.5 3.5  CL 105 102  CO2 22 24  GLUCOSE 136* 119*  BUN 18 21  CREATININE 0.74 0.78  CALCIUM 8.6* 8.5*   ------------------------------------------------------------------------------------------------------------------ CrCl cannot be calculated (Unknown ideal weight.). ------------------------------------------------------------------------------------------------------------------ No results for input(s): HGBA1C in the last 72 hours. ------------------------------------------------------------------------------------------------------------------ No results for input(s): CHOL, HDL, LDLCALC, TRIG, CHOLHDL, LDLDIRECT in the last 72 hours. ------------------------------------------------------------------------------------------------------------------ No results for input(s): TSH, T4TOTAL, T3FREE, THYROIDAB in the last 72 hours.  Invalid input(s): FREET3 ------------------------------------------------------------------------------------------------------------------ Recent Labs    06/30/18 0637  VITAMINB12 402  FOLATE 16.9  FERRITIN 36  TIBC 361  IRON 26*  RETICCTPCT 2.3     Coagulation profile Recent Labs  Lab 06/29/18 2333  INR 1.02    No results for input(s): DDIMER in the last 72 hours.  Cardiac  Enzymes No results for input(s): CKMB, TROPONINI, MYOGLOBIN in the last 168 hours.  Invalid input(s): CK ------------------------------------------------------------------------------------------------------------------ Invalid input(s): POCBNP   CBG: No results for input(s): GLUCAP in the last 168 hours.     Studies: Dg Chest 1 View  Result Date: 06/30/2018 CLINICAL DATA:  Initial evaluation for acute trauma, hip fracture. EXAM: CHEST  1 VIEW COMPARISON:  Prior radiograph from 07/22/2016. FINDINGS: Cardiomegaly, stable. Mediastinal silhouette within normal limits. Aortic atherosclerosis. Lungs somewhat hyperinflated with changes related to underlying COPD. No focal infiltrates. No edema or effusion. No appreciable pneumothorax on this supine view the chest. Diffuse osteopenia.  No acute osseous abnormality. IMPRESSION: 1. No active cardiopulmonary disease. 2. Underlying COPD. 3. Stable cardiomegaly without edema. 4. Aortic atherosclerosis. Electronically Signed   By: Rise Mu M.D.   On: 06/30/2018 00:55   Dg Tibia/fibula Left  Result Date: 06/30/2018 CLINICAL DATA:  Initial evaluation for acute trauma, fall. EXAM: LEFT TIBIA AND FIBULA - 2 VIEW COMPARISON:  None. FINDINGS: No acute fracture or dislocation. Diffuse osteopenia noted. No acute soft tissue abnormality. Vascular calcifications noted within the visualized leg. Degenerative osteoarthrosis with chondrocalcinosis noted at the knee. IMPRESSION: No acute osseous abnormality about the left tibia/fibula. Electronically Signed   By: Rise Mu M.D.   On: 06/30/2018 00:58   Ct Head Wo Contrast  Result Date: 06/30/2018 CLINICAL DATA:  Head trauma, minor, pt on anticoagulation; Head trauma, minor, GCS>=13, high clinical risk, initial exam. One hundred year old post fall this  afternoon. EXAM: CT HEAD WITHOUT CONTRAST CT CERVICAL SPINE WITHOUT CONTRAST TECHNIQUE: Multidetector CT imaging of the head and cervical spine was performed following the standard protocol without intravenous contrast. Multiplanar CT image reconstructions of the cervical spine were also generated. COMPARISON:  CT 07/22/2016 FINDINGS: CT HEAD FINDINGS Brain: Unchanged atrophy and chronic small vessel ischemia. No intracranial hemorrhage, mass effect, or midline shift. No hydrocephalus. The basilar cisterns are patent. No evidence of territorial infarct or acute ischemia. No extra-axial or intracranial fluid collection. Vascular: Atherosclerosis of skullbase vasculature without hyperdense vessel or abnormal calcification. Skull: No fracture or focal lesion. Sinuses/Orbits: Postsurgical change of the paranasal sinuses without acute inflammation. Mastoid air cells are clear. No acute orbital abnormality. Other: None. CT CERVICAL SPINE FINDINGS Patient had difficulty tolerating the exam, there is motion artifact. Alignment: No traumatic subluxation. Minimal anterolisthesis of C4 on C5 and C7 on T1, chronic and likely facet mediated. Skull base and vertebrae: No evidence of fracture, motion limits detailed assessment. The vertebral body heights are preserved. Degenerative change at C1-C2 again seen. Soft tissues and spinal canal: No prevertebral fluid or swelling. No visible canal hematoma. Disc levels: Disc space narrowing and endplate spurring, most prominent at C6-C7, unchanged from prior exam. Multilevel facet arthropathy. Upper chest: Biapical pleuroparenchymal scarring. No acute findings. Other: Carotid calcifications. IMPRESSION: 1. No acute intracranial abnormality. No skull fracture. Stable atrophy and chronic small vessel ischemia. 2. Motion limited evaluation of the cervical spine. No evidence of acute fracture. Degenerative change is grossly stable from 2018. Electronically Signed   By: Narda Rutherford M.D.    On: 06/30/2018 00:36   Ct Cervical Spine Wo Contrast  Result Date: 06/30/2018 CLINICAL DATA:  Head trauma, minor, pt on anticoagulation; Head trauma, minor, GCS>=13, high clinical risk, initial exam. One hundred year old post fall this afternoon. EXAM: CT HEAD WITHOUT CONTRAST CT CERVICAL SPINE WITHOUT CONTRAST TECHNIQUE: Multidetector CT imaging of the head and cervical spine was performed following the standard protocol without intravenous contrast.  Multiplanar CT image reconstructions of the cervical spine were also generated. COMPARISON:  CT 07/22/2016 FINDINGS: CT HEAD FINDINGS Brain: Unchanged atrophy and chronic small vessel ischemia. No intracranial hemorrhage, mass effect, or midline shift. No hydrocephalus. The basilar cisterns are patent. No evidence of territorial infarct or acute ischemia. No extra-axial or intracranial fluid collection. Vascular: Atherosclerosis of skullbase vasculature without hyperdense vessel or abnormal calcification. Skull: No fracture or focal lesion. Sinuses/Orbits: Postsurgical change of the paranasal sinuses without acute inflammation. Mastoid air cells are clear. No acute orbital abnormality. Other: None. CT CERVICAL SPINE FINDINGS Patient had difficulty tolerating the exam, there is motion artifact. Alignment: No traumatic subluxation. Minimal anterolisthesis of C4 on C5 and C7 on T1, chronic and likely facet mediated. Skull base and vertebrae: No evidence of fracture, motion limits detailed assessment. The vertebral body heights are preserved. Degenerative change at C1-C2 again seen. Soft tissues and spinal canal: No prevertebral fluid or swelling. No visible canal hematoma. Disc levels: Disc space narrowing and endplate spurring, most prominent at C6-C7, unchanged from prior exam. Multilevel facet arthropathy. Upper chest: Biapical pleuroparenchymal scarring. No acute findings. Other: Carotid calcifications. IMPRESSION: 1. No acute intracranial abnormality. No skull  fracture. Stable atrophy and chronic small vessel ischemia. 2. Motion limited evaluation of the cervical spine. No evidence of acute fracture. Degenerative change is grossly stable from 2018. Electronically Signed   By: Narda Rutherford M.D.   On: 06/30/2018 00:36   Dg Hand Complete Right  Result Date: 06/30/2018 CLINICAL DATA:  Initial evaluation for acute trauma, fall. EXAM: RIGHT HAND - COMPLETE 3+ VIEW COMPARISON:  None. FINDINGS: No acute fracture or dislocation. Moderate to advanced osteoarthritic changes present throughout the hand, most notable at the right first [facility] joint. Angulation at the right first MCP and IP joints likely chronic and degenerative. Suspected remotely healed fracture of the mid right fifth metacarpal shaft. No acute soft tissue abnormality. Diffuse osteopenia noted. IMPRESSION: No acute osseous abnormality about the right hand. Electronically Signed   By: Rise Mu M.D.   On: 06/30/2018 00:57   Dg Hip Unilat With Pelvis 2-3 Views Right  Result Date: 06/30/2018 CLINICAL DATA:  Initial evaluation for acute trauma, fall. EXAM: DG HIP (WITH OR WITHOUT PELVIS) 2-3V RIGHT COMPARISON:  None. FINDINGS: Complex and comminuted intratrochanteric fracture of the right hip with mild displacement and superior subluxation. Femoral head remains normally position within the acetabulum. Bony pelvis intact. No acute abnormality about the left hip. Diffuse osteopenia noted. Soft tissue swelling overlies the right hip. IMPRESSION: Acute comminuted intertrochanteric fracture of the right hip. Electronically Signed   By: Rise Mu M.D.   On: 06/30/2018 00:53      No results found for: HGBA1C Lab Results  Component Value Date   CREATININE 0.78 06/30/2018       Scheduled Meds: . amLODipine  5 mg Oral Daily  . busPIRone  10 mg Oral BID  . docusate sodium  100 mg Oral BID  . dorzolamide-timolol  1 drop Both Eyes BID  . FLUoxetine  20 mg Oral Daily  . levETIRAcetam   500 mg Oral BID  . levothyroxine  50 mcg Oral Q0600  . memantine  28 mg Oral Daily  . povidone-iodine  2 application Topical Once  . tranexamic acid (CYKLOKAPRON) topical -INTRAOP  2,000 mg Topical To OR   Continuous Infusions: .  ceFAZolin (ANCEF) IV    . tranexamic acid       LOS: 0 days    Time spent: >  30 MINS    Richarda OverlieNayana Lyann Hagstrom  [facility] Pager [phone]. If 7PM-7AM, please contact night-coverage at [email or web address], password [email or web address] 06/30/2018, 2:42 PM  LOS: 0 days

## 2018-06-30 NOTE — Consult Note (Signed)
ORTHOPAEDIC CONSULTATION  REQUESTING PHYSICIAN: Richarda Overlie, MD  Chief Complaint: Right intertroch hip fracture  HPI: Rachel Barry is a 85 y.o. female who presents with right hip fracture s/p mechanical fall PTA at her living facility.  The patient endorses severe pain in the right hip, that does not radiate, grinding in quality, worse with any movement, better with immobilization.  She has dementia and walks minimally with a walker from bedroom to bathroom.    Past Medical History:  Diagnosis Date  . Anxiety   . Arthritis   . Dementia (HCC)   . Glaucoma   . Hypertension   . Seizures (HCC)   . Thyroid disease    Past Surgical History:  Procedure Laterality Date  . HERNIA REPAIR    . NASAL SINUS SURGERY     Social History   Socioeconomic History  . Marital status: Widowed    Spouse name: Not on file  . Number of children: Not on file  . Years of education: Not on file  . Highest education level: Not on file  Occupational History  . Not on file  Social Needs  . Financial resource strain: Not on file  . Food insecurity:    Worry: Not on file    Inability: Not on file  . Transportation needs:    Medical: Not on file    Non-medical: Not on file  Tobacco Use  . Smoking status: Never Smoker  . Smokeless tobacco: Never Used  Substance and Sexual Activity  . Alcohol use: No  . Drug use: No  . Sexual activity: Never  Lifestyle  . Physical activity:    Days per week: Not on file    Minutes per session: Not on file  . Stress: Not on file  Relationships  . Social connections:    Talks on phone: Not on file    Gets together: Not on file    Attends religious service: Not on file    Active member of club or organization: Not on file    Attends meetings of clubs or organizations: Not on file    Relationship status: Not on file  Other Topics Concern  . Not on file  Social History Narrative  . Not on file   Family History  Problem Relation Age of Onset  .  Rectal cancer Father    Allergies  Allergen Reactions  . Ciprofloxacin Hcl Other (See Comments)    Not disclosed on MAR   Prior to Admission medications   Medication Sig Start Date End Date Taking? Authorizing Provider  acetaminophen (TYLENOL) 325 MG tablet Take 325 mg by mouth 3 (three) times daily. Taking every day per [facility]    Yes [provider]  acetaminophen (TYLENOL) 500 MG tablet Take 500 mg by mouth daily as needed for moderate pain.   Yes [provider]  amLODipine (NORVASC) 5 MG tablet Take 5 mg by mouth daily.    Yes [provider]  aspirin EC 81 MG tablet Take 81 mg by mouth daily.   Yes [provider]  busPIRone (BUSPAR) 10 MG tablet Take 10 mg by mouth 2 (two) times daily.    Yes [provider]  Cranberry-Vitamin C-Inulin (UTI-STAT) LIQD Take 30 mLs by mouth daily.   Yes [provider]  docusate sodium (COLACE) 100 MG capsule Take 100 mg by mouth daily.   Yes [provider]  dorzolamide-timolol (COSOPT) 22.3-6.8 MG/ML ophthalmic solution Place 1 drop into both eyes 2 (two)  times daily. Preservative Free 08/09/13  Yes [provider]  FLUoxetine (PROZAC) 20 MG capsule Take 20 mg by mouth daily.    Yes [provider]  guaifenesin (ROBITUSSIN) 100 MG/5ML syrup Take 200 mg by mouth 4 (four) times daily as needed for cough.   Yes [provider]  HYDROcodone-acetaminophen (NORCO/VICODIN) 5-325 MG tablet Take 1 tablet by mouth every 6 (six) hours as needed for moderate pain.   Yes [provider]  levETIRAcetam (KEPPRA) 500 MG tablet Take 500 mg by mouth 2 (two) times daily.   Yes [provider]  levothyroxine (SYNTHROID, LEVOTHROID) 50 MCG tablet Take 50 mcg by mouth daily. 04/03/15  Yes [provider]  LORazepam (ATIVAN) 0.5 MG tablet Take 0.5 mg by mouth every 6 (six) hours as needed for anxiety.   Yes [provider]  memantine (NAMENDA XR) 28 MG CP24  24 hr capsule Take 28 mg by mouth.   Yes [provider]  Multiple Vitamin (MULTIVITAMIN WITH MINERALS) TABS tablet Take 1 tablet by mouth daily.   Yes [provider]  NON FORMULARY Take 4 oz by mouth 2 (two) times daily. Med Pass 2.0   Yes [provider]  polyethylene glycol (MIRALAX / GLYCOLAX) packet Take 17 g by mouth daily.   Yes [provider]  calcitRIOL (ROCALTROL) 0.25 MCG capsule Take 0.25 mcg by mouth daily. 03/10/15   [provider]  Calcium Citrate-Vitamin D (CITRACAL + D PO) Take 1 tablet by mouth daily.    [provider]  triamcinolone cream (KENALOG) 0.1 % Apply 1 application topically daily as needed.    [provider]   Dg Chest 1 View  Result Date: 06/30/2018 CLINICAL DATA:  Initial evaluation for acute trauma, hip fracture. EXAM: CHEST  1 VIEW COMPARISON:  Prior radiograph from 07/22/2016. FINDINGS: Cardiomegaly, stable. Mediastinal silhouette within normal limits. Aortic atherosclerosis. Lungs somewhat hyperinflated with changes related to underlying COPD. No focal infiltrates. No edema or effusion. No appreciable pneumothorax on this supine view the chest. Diffuse osteopenia.  No acute osseous abnormality. IMPRESSION: 1. No active cardiopulmonary disease. 2. Underlying COPD. 3. Stable cardiomegaly without edema. 4. Aortic atherosclerosis. Electronically Signed   By: Rise MuBenjamin  McClintock M.D.   On: 06/30/2018 00:55   Dg Tibia/fibula Left  Result Date: 06/30/2018 CLINICAL DATA:  Initial evaluation for acute trauma, fall. EXAM: LEFT TIBIA AND FIBULA - 2 VIEW COMPARISON:  None. FINDINGS: No acute fracture or dislocation. Diffuse osteopenia noted. No acute soft tissue abnormality. Vascular calcifications noted within the visualized leg. Degenerative osteoarthrosis with chondrocalcinosis noted at the knee. IMPRESSION: No acute osseous abnormality about the left tibia/fibula. Electronically Signed   By: Rise MuBenjamin   McClintock M.D.   On: 06/30/2018 00:58   Ct Head Wo Contrast  Result Date: 06/30/2018 CLINICAL DATA:  Head trauma, minor, pt on anticoagulation; Head trauma, minor, GCS>=13, high clinical risk, initial exam. One hundred year old post fall this afternoon. EXAM: CT HEAD WITHOUT CONTRAST CT CERVICAL SPINE WITHOUT CONTRAST TECHNIQUE: Multidetector CT imaging of the head and cervical spine was performed following the standard protocol without intravenous contrast. Multiplanar CT image reconstructions of the cervical spine were also generated. COMPARISON:  CT 07/22/2016 FINDINGS: CT HEAD FINDINGS Brain: Unchanged atrophy and chronic small vessel ischemia. No intracranial hemorrhage, mass effect, or midline shift. No hydrocephalus. The basilar cisterns are patent. No evidence of territorial infarct or acute ischemia. No extra-axial or intracranial fluid collection. Vascular: Atherosclerosis of skullbase vasculature without hyperdense vessel or  abnormal calcification. Skull: No fracture or focal lesion. Sinuses/Orbits: Postsurgical change of the paranasal sinuses without acute inflammation. Mastoid air cells are clear. No acute orbital abnormality. Other: None. CT CERVICAL SPINE FINDINGS Patient had difficulty tolerating the exam, there is motion artifact. Alignment: No traumatic subluxation. Minimal anterolisthesis of C4 on C5 and C7 on T1, chronic and likely facet mediated. Skull base and vertebrae: No evidence of fracture, motion limits detailed assessment. The vertebral body heights are preserved. Degenerative change at C1-C2 again seen. Soft tissues and spinal canal: No prevertebral fluid or swelling. No visible canal hematoma. Disc levels: Disc space narrowing and endplate spurring, most prominent at C6-C7, unchanged from prior exam. Multilevel facet arthropathy. Upper chest: Biapical pleuroparenchymal scarring. No acute findings. Other: Carotid calcifications. IMPRESSION: 1. No acute intracranial abnormality. No  skull fracture. Stable atrophy and chronic small vessel ischemia. 2. Motion limited evaluation of the cervical spine. No evidence of acute fracture. Degenerative change is grossly stable from 2018. Electronically Signed   By: Narda Rutherford M.D.   On: 06/30/2018 00:36   Ct Cervical Spine Wo Contrast  Result Date: 06/30/2018 CLINICAL DATA:  Head trauma, minor, pt on anticoagulation; Head trauma, minor, GCS>=13, high clinical risk, initial exam. One hundred year old post fall this afternoon. EXAM: CT HEAD WITHOUT CONTRAST CT CERVICAL SPINE WITHOUT CONTRAST TECHNIQUE: Multidetector CT imaging of the head and cervical spine was performed following the standard protocol without intravenous contrast. Multiplanar CT image reconstructions of the cervical spine were also generated. COMPARISON:  CT 07/22/2016 FINDINGS: CT HEAD FINDINGS Brain: Unchanged atrophy and chronic small vessel ischemia. No intracranial hemorrhage, mass effect, or midline shift. No hydrocephalus. The basilar cisterns are patent. No evidence of territorial infarct or acute ischemia. No extra-axial or intracranial fluid collection. Vascular: Atherosclerosis of skullbase vasculature without hyperdense vessel or abnormal calcification. Skull: No fracture or focal lesion. Sinuses/Orbits: Postsurgical change of the paranasal sinuses without acute inflammation. Mastoid air cells are clear. No acute orbital abnormality. Other: None. CT CERVICAL SPINE FINDINGS Patient had difficulty tolerating the exam, there is motion artifact. Alignment: No traumatic subluxation. Minimal anterolisthesis of C4 on C5 and C7 on T1, chronic and likely facet mediated. Skull base and vertebrae: No evidence of fracture, motion limits detailed assessment. The vertebral body heights are preserved. Degenerative change at C1-C2 again seen. Soft tissues and spinal canal: No prevertebral fluid or swelling. No visible canal hematoma. Disc levels: Disc space narrowing and endplate  spurring, most prominent at C6-C7, unchanged from prior exam. Multilevel facet arthropathy. Upper chest: Biapical pleuroparenchymal scarring. No acute findings. Other: Carotid calcifications. IMPRESSION: 1. No acute intracranial abnormality. No skull fracture. Stable atrophy and chronic small vessel ischemia. 2. Motion limited evaluation of the cervical spine. No evidence of acute fracture. Degenerative change is grossly stable from 2018. Electronically Signed   By: Narda Rutherford M.D.   On: 06/30/2018 00:36   Dg Hand Complete Right  Result Date: 06/30/2018 CLINICAL DATA:  Initial evaluation for acute trauma, fall. EXAM: RIGHT HAND - COMPLETE 3+ VIEW COMPARISON:  None. FINDINGS: No acute fracture or dislocation. Moderate to advanced osteoarthritic changes present throughout the hand, most notable at the right first [facility] joint. Angulation at the right first MCP and IP joints likely chronic and degenerative. Suspected remotely healed fracture of the mid right fifth metacarpal shaft. No acute soft tissue abnormality. Diffuse osteopenia noted. IMPRESSION: No acute osseous abnormality about the right hand. Electronically Signed   By: Rise Mu M.D.   On: 06/30/2018 00:57  Dg Hip Unilat With Pelvis 2-3 Views Right  Result Date: 06/30/2018 CLINICAL DATA:  Initial evaluation for acute trauma, fall. EXAM: DG HIP (WITH OR WITHOUT PELVIS) 2-3V RIGHT COMPARISON:  None. FINDINGS: Complex and comminuted intratrochanteric fracture of the right hip with mild displacement and superior subluxation. Femoral head remains normally position within the acetabulum. Bony pelvis intact. No acute abnormality about the left hip. Diffuse osteopenia noted. Soft tissue swelling overlies the right hip. IMPRESSION: Acute comminuted intertrochanteric fracture of the right hip. Electronically Signed   By: Rise MuBenjamin  McClintock M.D.   On: 06/30/2018 00:53    All pertinent xrays, MRI, CT independently reviewed and  interpreted  Positive ROS: All other systems have been reviewed and were otherwise negative with the exception of those mentioned in the HPI and as above.  Physical Exam: General: no acute distress Cardiovascular: No pedal edema Respiratory: No cyanosis, no use of accessory musculature GI: No organomegaly, abdomen is soft and non-tender Skin: No lesions in the area of chief complaint Neurologic: Sensation intact distally Psychiatric: Patient is oriented to person and place Lymphatic: No axillary or cervical lymphadenopathy  MUSCULOSKELETAL:  - pain with movement of the hip and extremity - skin intact - NVI distally - compartments soft  Assessment: Right intertroch hip fracture  Plan: - surgical fixation is recommended, I spoke with son, Raiford NobleRick, who is HCPOA - informed consent obtained from son - medical optimization per primary team - surgery is planned for this afternoon - son is aware that the goal of surgery to provide pain relief and ability to mobilize with PT; hip fx carry a 20-30% of 1 year mortality and she may not ever walk again  Thank you for the consult and the opportunity to see Ms. Kuba  N. Glee ArvinMichael Nesta Scaturro, MD [facility] [phone] 7:56 AM

## 2018-06-30 NOTE — ED Notes (Signed)
Dr. Georgina Pillion (Pt Son) POA [phone]  Senai Lebovits (Daughter) [phone]

## 2018-06-30 NOTE — Transfer of Care (Signed)
Immediate Anesthesia Transfer of Care Note  Patient: Rachel Barry  Procedure(s) Performed: INTRAMEDULLARY (IM) NAIL RIGHT INTERTROCHANTRIC (Right Leg Upper)  Patient Location: PACU  Anesthesia Type:General  Level of Consciousness: drowsy and responds to stimulation  Airway & Oxygen Therapy: Patient Spontanous Breathing and Patient connected to face mask oxygen  Post-op Assessment: Report given to RN and Post -op Vital signs reviewed and stable  Post vital signs: Reviewed and stable  Last Vitals:  Vitals Value Taken Time  BP 108/59 06/30/2018  7:31 PM  Temp    Pulse 73 06/30/2018  7:33 PM  Resp 18 06/30/2018  7:33 PM  SpO2 100 % 06/30/2018  7:33 PM  Vitals shown include unvalidated device data.  Last Pain:  Vitals:   06/30/18 1210  TempSrc:   PainSc: Asleep         Complications: No apparent anesthesia complications

## 2018-06-30 NOTE — Anesthesia Preprocedure Evaluation (Addendum)
Anesthesia Evaluation  Patient identified by MRN, date of birth, ID band Patient confused    Reviewed: Allergy & Precautions, NPO status , Patient's Chart, lab work & pertinent test results  Airway Mallampati: II  TM Distance: >3 FB Neck ROM: Full    Dental  (+) Dental Advisory Given   Pulmonary neg pulmonary ROS,    breath sounds clear to auscultation       Cardiovascular hypertension, Pt. on medications  Rhythm:Regular Rate:Normal     Neuro/Psych Seizures -, Well Controlled,  Dementia    GI/Hepatic negative GI ROS, Neg liver ROS,   Endo/Other  negative endocrine ROS  Renal/GU negative Renal ROS     Musculoskeletal  (+) Arthritis ,   Abdominal   Peds  Hematology  (+) anemia ,   Anesthesia Other Findings   Reproductive/Obstetrics                             Lab Results  Component Value Date   WBC 9.6 06/30/2018   HGB 8.6 (L) 06/30/2018   HCT 28.9 (L) 06/30/2018   MCV 88.4 06/30/2018   PLT 222 06/30/2018   Lab Results  Component Value Date   CREATININE 0.78 06/30/2018   BUN 21 06/30/2018   NA 137 06/30/2018   K 3.5 06/30/2018   CL 102 06/30/2018   CO2 24 06/30/2018    Anesthesia Physical Anesthesia Plan  ASA: III  Anesthesia Plan: General   Post-op Pain Management:    Induction: Intravenous  PONV Risk Score and Plan: 3 and Ondansetron, Dexamethasone and Treatment may vary due to age or medical condition  Airway Management Planned: Oral ETT  Additional Equipment:   Intra-op Plan:   Post-operative Plan: Extubation in OR  Informed Consent: I have reviewed the patients History and Physical, chart, labs and discussed the procedure including the risks, benefits and alternatives for the proposed anesthesia with the patient or authorized representative who has indicated his/her understanding and acceptance.   Dental advisory given and Consent reviewed with POA  Plan  Discussed with: CRNA  Anesthesia Plan Comments: (DNR discussed with pts POA Georgina Pillion, MD who is the patients son. Discussed that during the perioperative period we would be giving medicines that will affect HR, BP, breathing etc and that often times the DNR is suspended due to these side effects of the medications. He stated that she would definitely not want any chest compressions or defibrillation given her current state of health and asked that we honor those wishes. Medications are fine. Family expressed understanding of the setting and how all measures would likely be short and temporary but still would like to avoid chest compressions and defibrillation.   Glade Stanford, MD)       Anesthesia Quick Evaluation

## 2018-06-30 NOTE — H&P (Signed)
History and Physical    Rachel Barry ZOX:[id number] DOB: 1915/10/25 DOA: 06/29/2018  PCP: System, Pcp Not In  Patient coming from: Skilled nursing facility.  Chief Complaint: Fall.  HPI: Rachel Barry is a 85 y.o. female with history of hypertension, seizures, hypothyroidism, dementia, anemia was brought to the ER after patient had a fall at her living facility.  Patient stood up from the commode and missed a balance and fell onto the side and hit her head in the process onto the commode.  Unsure if patient lost consciousness.  But did complain of right hip pain and x-rays done in the outside facility showed fracture and was referred to the ER.  Per the report patient has had a previous fall 2 days ago.  Most of the history was obtained from the ER physician who had obtained information from patient's daughter.  ED Course: In the ER x-rays of the hip showed right hip fracture.  Dr. Roda Shutters orthopedic surgeon has been consulted.  As per the daughter patient does ambulate with help of walker.  CT head C-spine x-ray of the right hand and left lower extremity were unremarkable.  Patient admitted for further management of right hip fracture.  Hemoglobin has dropped by 2 g from recent done in October as per the records available from the skilled nursing facility at the time was around 10.  Review of Systems: As per HPI, rest all negative.   Past Medical History:  Diagnosis Date  . Anxiety   . Arthritis   . Dementia (HCC)   . Glaucoma   . Hypertension   . Seizures (HCC)   . Thyroid disease     Past Surgical History:  Procedure Laterality Date  . HERNIA REPAIR    . NASAL SINUS SURGERY       reports that she has never smoked. She has never used smokeless tobacco. She reports that she does not drink alcohol or use drugs.  No Known Allergies  Family History  Problem Relation Age of Onset  . Rectal cancer Father     Prior to Admission medications   Medication Sig Start Date End Date  Taking? Authorizing Provider  acetaminophen (TYLENOL) 500 MG tablet Take 500 mg by mouth 3 (three) times daily. Taking every day per [facility]    [provider]  amLODipine (NORVASC) 2.5 MG tablet Take 2.5 mg by mouth daily.    [provider]  aspirin EC 81 MG tablet Take 81 mg by mouth daily.    [provider]  busPIRone (BUSPAR) 5 MG tablet Take 5 mg by mouth 2 (two) times daily.    [provider]  calcitRIOL (ROCALTROL) 0.25 MCG capsule Take 0.25 mcg by mouth daily. 03/10/15   [provider]  Calcium Citrate-Vitamin D (CITRACAL + D PO) Take 1 tablet by mouth daily.    [provider]  Cranberry-Vitamin C-Inulin (UTI-STAT) LIQD Take 30 mLs by mouth daily.    [provider]  docusate sodium (COLACE) 100 MG capsule Take 100 mg by mouth daily.    [provider]  dorzolamide-timolol (COSOPT) 22.3-6.8 MG/ML ophthalmic solution Place 1 drop into both eyes 2 (two) times daily. Preservative Free 08/09/13   [provider]  FLUoxetine (PROZAC) 40 MG capsule Take 40 mg by mouth daily.    [provider]  HYDROcodone-acetaminophen (NORCO/VICODIN) 5-325 MG tablet Take 1 tablet by mouth every 6 (six) hours as needed for moderate pain.    [provider]  levETIRAcetam (  KEPPRA) 500 MG tablet Take 500 mg by mouth 2 (two) times daily.    [provider]  levothyroxine (SYNTHROID, LEVOTHROID) 50 MCG tablet Take 50 mcg by mouth daily. 04/03/15   [provider]  memantine (NAMENDA XR) 28 MG CP24 24 hr capsule Take 28 mg by mouth.    [provider]  Multiple Vitamin (MULTIVITAMIN WITH MINERALS) TABS tablet Take 1 tablet by mouth daily.    [provider]  polyethylene glycol (MIRALAX / GLYCOLAX) packet Take 17 g by mouth daily.    [provider]  triamcinolone cream (KENALOG) 0.1 % Apply 1 application topically daily as needed.    [provider]    Physical  Exam: Vitals:   06/29/18 2328 06/29/18 2330  BP:  (!) 147/81  Pulse:  68  Resp:  18  Temp:  98.2 F (36.8 C)  TempSrc:  Oral  SpO2: 98% 92%      Constitutional: Moderately built and nourished. Vitals:   06/29/18 2328 06/29/18 2330  BP:  (!) 147/81  Pulse:  68  Resp:  18  Temp:  98.2 F (36.8 C)  TempSrc:  Oral  SpO2: 98% 92%   Eyes: Anicteric no pallor. ENMT: No discharge from the ears eyes nose or mouth. Neck: No mass felt.  No neck rigidity. Respiratory: No rhonchi or crepitations. Cardiovascular: S1-S2 heard. Abdomen: Soft nontender bowel sounds present. Musculoskeletal: Pain on moving right hip. Skin: Chronic skin changes. Neurologic: Patient is alert awake but not oriented.  Moves all extremities. Psychiatric: Oriented to place person not time.   Labs on Admission: I have personally reviewed following labs and imaging studies  CBC: Recent Labs  Lab 06/29/18 2333  WBC 11.9*  NEUTROABS 10.6*  HGB 8.8*  HCT 30.5*  MCV 88.2  PLT 226   Basic Metabolic Panel: Recent Labs  Lab 06/29/18 2333  NA 137  K 3.5  CL 105  CO2 22  GLUCOSE 136*  BUN 18  CREATININE 0.74  CALCIUM 8.6*   GFR: CrCl cannot be calculated (Unknown ideal weight.). Liver Function Tests: No results for input(s): AST, ALT, ALKPHOS, BILITOT, PROT, ALBUMIN in the last 168 hours. No results for input(s): LIPASE, AMYLASE in the last 168 hours. No results for input(s): AMMONIA in the last 168 hours. Coagulation Profile: Recent Labs  Lab 06/29/18 2333  INR 1.02   Cardiac Enzymes: No results for input(s): CKTOTAL, CKMB, CKMBINDEX, TROPONINI in the last 168 hours. BNP (last 3 results) No results for input(s): PROBNP in the last 8760 hours. HbA1C: No results for input(s): HGBA1C in the last 72 hours. CBG: No results for input(s): GLUCAP in the last 168 hours. Lipid Profile: No results for input(s): CHOL, HDL, LDLCALC, TRIG, CHOLHDL, LDLDIRECT in the last 72 hours. Thyroid Function  Tests: No results for input(s): TSH, T4TOTAL, FREET4, T3FREE, THYROIDAB in the last 72 hours. Anemia Panel: No results for input(s): VITAMINB12, FOLATE, FERRITIN, TIBC, IRON, RETICCTPCT in the last 72 hours. Urine analysis:    Component Value Date/Time   COLORURINE YELLOW 05/14/2015 1825   APPEARANCEUR CLEAR 05/14/2015 1825   LABSPEC 1.018 05/14/2015 1825   PHURINE 6.0 05/14/2015 1825   GLUCOSEU NEGATIVE 05/14/2015 1825   HGBUR NEGATIVE 05/14/2015 1825   BILIRUBINUR NEGATIVE 05/14/2015 1825   KETONESUR NEGATIVE 05/14/2015 1825   PROTEINUR NEGATIVE 05/14/2015 1825   NITRITE NEGATIVE 05/14/2015 1825   LEUKOCYTESUR NEGATIVE 05/14/2015 1825   Sepsis Labs: @LABRCNTIP (procalcitonin:4,lacticidven:4) )No results found for this or any previous visit (from the  past 240 hour(s)).   Radiological Exams on Admission: Dg Chest 1 View  Result Date: 06/30/2018 CLINICAL DATA:  Initial evaluation for acute trauma, hip fracture. EXAM: CHEST  1 VIEW COMPARISON:  Prior radiograph from 07/22/2016. FINDINGS: Cardiomegaly, stable. Mediastinal silhouette within normal limits. Aortic atherosclerosis. Lungs somewhat hyperinflated with changes related to underlying COPD. No focal infiltrates. No edema or effusion. No appreciable pneumothorax on this supine view the chest. Diffuse osteopenia.  No acute osseous abnormality. IMPRESSION: 1. No active cardiopulmonary disease. 2. Underlying COPD. 3. Stable cardiomegaly without edema. 4. Aortic atherosclerosis. Electronically Signed   By: Rise Mu M.D.   On: 06/30/2018 00:55   Dg Tibia/fibula Left  Result Date: 06/30/2018 CLINICAL DATA:  Initial evaluation for acute trauma, fall. EXAM: LEFT TIBIA AND FIBULA - 2 VIEW COMPARISON:  None. FINDINGS: No acute fracture or dislocation. Diffuse osteopenia noted. No acute soft tissue abnormality. Vascular calcifications noted within the visualized leg. Degenerative osteoarthrosis with chondrocalcinosis noted at the knee.  IMPRESSION: No acute osseous abnormality about the left tibia/fibula. Electronically Signed   By: Rise Mu M.D.   On: 06/30/2018 00:58   Ct Head Wo Contrast  Result Date: 06/30/2018 CLINICAL DATA:  Head trauma, minor, pt on anticoagulation; Head trauma, minor, GCS>=13, high clinical risk, initial exam. One [age] year old post fall this afternoon. EXAM: CT HEAD WITHOUT CONTRAST CT CERVICAL SPINE WITHOUT CONTRAST TECHNIQUE: Multidetector CT imaging of the head and cervical spine was performed following the standard protocol without intravenous contrast. Multiplanar CT image reconstructions of the cervical spine were also generated. COMPARISON:  CT 07/22/2016 FINDINGS: CT HEAD FINDINGS Brain: Unchanged atrophy and chronic small vessel ischemia. No intracranial hemorrhage, mass effect, or midline shift. No hydrocephalus. The basilar cisterns are patent. No evidence of territorial infarct or acute ischemia. No extra-axial or intracranial fluid collection. Vascular: Atherosclerosis of skullbase vasculature without hyperdense vessel or abnormal calcification. Skull: No fracture or focal lesion. Sinuses/Orbits: Postsurgical change of the paranasal sinuses without acute inflammation. Mastoid air cells are clear. No acute orbital abnormality. Other: None. CT CERVICAL SPINE FINDINGS Patient had difficulty tolerating the exam, there is motion artifact. Alignment: No traumatic subluxation. Minimal anterolisthesis of C4 on C5 and C7 on T1, chronic and likely facet mediated. Skull base and vertebrae: No evidence of fracture, motion limits detailed assessment. The vertebral body heights are preserved. Degenerative change at C1-C2 again seen. Soft tissues and spinal canal: No prevertebral fluid or swelling. No visible canal hematoma. Disc levels: Disc space narrowing and endplate spurring, most prominent at C6-C7, unchanged from prior exam. Multilevel facet arthropathy. Upper chest: Biapical pleuroparenchymal  scarring. No acute findings. Other: Carotid calcifications. IMPRESSION: 1. No acute intracranial abnormality. No skull fracture. Stable atrophy and chronic small vessel ischemia. 2. Motion limited evaluation of the cervical spine. No evidence of acute fracture. Degenerative change is grossly stable from 2018. Electronically Signed   By: Narda Rutherford M.D.   On: 06/30/2018 00:36   Ct Cervical Spine Wo Contrast  Result Date: 06/30/2018 CLINICAL DATA:  Head trauma, minor, pt on anticoagulation; Head trauma, minor, GCS>=13, high clinical risk, initial exam. One hundred year old post fall this afternoon. EXAM: CT HEAD WITHOUT CONTRAST CT CERVICAL SPINE WITHOUT CONTRAST TECHNIQUE: Multidetector CT imaging of the head and cervical spine was performed following the standard protocol without intravenous contrast. Multiplanar CT image reconstructions of the cervical spine were also generated. COMPARISON:  CT 07/22/2016 FINDINGS: CT HEAD FINDINGS Brain: Unchanged atrophy and chronic small vessel ischemia. No intracranial hemorrhage,  mass effect, or midline shift. No hydrocephalus. The basilar cisterns are patent. No evidence of territorial infarct or acute ischemia. No extra-axial or intracranial fluid collection. Vascular: Atherosclerosis of skullbase vasculature without hyperdense vessel or abnormal calcification. Skull: No fracture or focal lesion. Sinuses/Orbits: Postsurgical change of the paranasal sinuses without acute inflammation. Mastoid air cells are clear. No acute orbital abnormality. Other: None. CT CERVICAL SPINE FINDINGS Patient had difficulty tolerating the exam, there is motion artifact. Alignment: No traumatic subluxation. Minimal anterolisthesis of C4 on C5 and C7 on T1, chronic and likely facet mediated. Skull base and vertebrae: No evidence of fracture, motion limits detailed assessment. The vertebral body heights are preserved. Degenerative change at C1-C2 again seen. Soft tissues and spinal canal:  No prevertebral fluid or swelling. No visible canal hematoma. Disc levels: Disc space narrowing and endplate spurring, most prominent at C6-C7, unchanged from prior exam. Multilevel facet arthropathy. Upper chest: Biapical pleuroparenchymal scarring. No acute findings. Other: Carotid calcifications. IMPRESSION: 1. No acute intracranial abnormality. No skull fracture. Stable atrophy and chronic small vessel ischemia. 2. Motion limited evaluation of the cervical spine. No evidence of acute fracture. Degenerative change is grossly stable from 2018. Electronically Signed   By: Narda RutherfordMelanie  Sanford M.D.   On: 06/30/2018 00:36   Dg Hand Complete Right  Result Date: 06/30/2018 CLINICAL DATA:  Initial evaluation for acute trauma, fall. EXAM: RIGHT HAND - COMPLETE 3+ VIEW COMPARISON:  None. FINDINGS: No acute fracture or dislocation. Moderate to advanced osteoarthritic changes present throughout the hand, most notable at the right first [facility] joint. Angulation at the right first MCP and IP joints likely chronic and degenerative. Suspected remotely healed fracture of the mid right fifth metacarpal shaft. No acute soft tissue abnormality. Diffuse osteopenia noted. IMPRESSION: No acute osseous abnormality about the right hand. Electronically Signed   By: Rise MuBenjamin  McClintock M.D.   On: 06/30/2018 00:57   Dg Hip Unilat With Pelvis 2-3 Views Right  Result Date: 06/30/2018 CLINICAL DATA:  Initial evaluation for acute trauma, fall. EXAM: DG HIP (WITH OR WITHOUT PELVIS) 2-3V RIGHT COMPARISON:  None. FINDINGS: Complex and comminuted intratrochanteric fracture of the right hip with mild displacement and superior subluxation. Femoral head remains normally position within the acetabulum. Bony pelvis intact. No acute abnormality about the left hip. Diffuse osteopenia noted. Soft tissue swelling overlies the right hip. IMPRESSION: Acute comminuted intertrochanteric fracture of the right hip. Electronically Signed   By: Rise MuBenjamin   McClintock M.D.   On: 06/30/2018 00:53    EKG: Independently reviewed.  Normal sinus rhythm with APCs.  Assessment/Plan Principal Problem:   Hip fracture, right, closed, initial encounter [facility](HCC) Active Problems:   Seizure (HCC)   Essential hypertension   Normochromic normocytic anemia   Hip fracture (HCC)    1. Right hip fracture status post mechanical fall -given the patient's age and comorbidities patient will be at high risk for intermediate risk procedure.  We will keep patient n.p.o. in anticipation of procedure for now.  Orthopedic surgeon to come and reevaluate and to discuss with patient's son who is the power of attorney with regarding to further management. 2. Hypertension on amlodipine. 3. Normocytic normochromic anemia 2 g further drop from recent past.  Follow CBC check anemia panel.  Type and screen. 4. History of seizures on Keppra. 5. Hypothyroidism on Synthroid. 6. Dementia on Namenda. 7. Anxiety on Prozac and BuSpar.   DVT prophylaxis: SCDs. Code Status: DNR as per the records from skilled nursing facility. Family Communication: No family at  the bedside. Disposition Plan: Will need skilled nursing facility. Consults called: Orthopedics. Admission status: Inpatient.   Eduard Clos MD [facility] Pager [phone].  If 7PM-7AM, please contact night-coverage [email or web address] Password [email or web address]  06/30/2018, 2:07 AM

## 2018-06-30 NOTE — ED Notes (Signed)
Bladder scan completed, 272 ml resulted. Pur wick in place, no output noted.

## 2018-07-01 LAB — COMPREHENSIVE METABOLIC PANEL
ALT: 19 U/L (ref 0–44)
AST: 26 U/L (ref 15–41)
Albumin: 3 g/dL — ABNORMAL LOW (ref 3.5–5.0)
Alkaline Phosphatase: 58 U/L (ref 38–126)
Anion gap: 12 (ref 5–15)
BUN: 31 mg/dL — ABNORMAL HIGH (ref 8–23)
CO2: 21 mmol/L — AB (ref 22–32)
Calcium: 7.9 mg/dL — ABNORMAL LOW (ref 8.9–10.3)
Chloride: 105 mmol/L (ref 98–111)
Creatinine, Ser: 1.2 mg/dL — ABNORMAL HIGH (ref 0.44–1.00)
GFR calc Af Amer: 43 mL/min — ABNORMAL LOW (ref >60)
GFR calc non Af Amer: 37 mL/min — ABNORMAL LOW (ref >60)
Glucose, Bld: 132 mg/dL — ABNORMAL HIGH (ref 70–99)
Potassium: 3.5 mmol/L (ref 3.5–5.1)
Sodium: 138 mmol/L (ref 135–145)
Total Bilirubin: 0.6 mg/dL (ref 0.3–1.2)
Total Protein: 5.3 g/dL — ABNORMAL LOW (ref 6.5–8.1)

## 2018-07-01 LAB — CBC
HCT: 21.1 % — ABNORMAL LOW (ref 36.0–46.0)
HEMOGLOBIN: 6.4 g/dL — AB (ref 12.0–15.0)
MCH: 26.6 pg (ref 26.0–34.0)
MCHC: 30.3 g/dL (ref 30.0–36.0)
MCV: 87.6 fL (ref 80.0–100.0)
Platelets: 174 10*3/uL (ref 150–400)
RBC: 2.41 MIL/uL — ABNORMAL LOW (ref 3.87–5.11)
RDW: 16.2 % — ABNORMAL HIGH (ref 11.5–15.5)
WBC: 9.7 10*3/uL (ref 4.0–10.5)
nRBC: 0.2 % (ref 0.0–0.2)

## 2018-07-01 LAB — HEMOGLOBIN AND HEMATOCRIT, BLOOD
HCT: 30.8 % — ABNORMAL LOW (ref 36.0–46.0)
HEMOGLOBIN: 9.9 g/dL — AB (ref 12.0–15.0)

## 2018-07-01 LAB — TYPE AND SCREEN
ABO/RH(D): O POS
ANTIBODY SCREEN: NEGATIVE

## 2018-07-01 LAB — PREPARE RBC (CROSSMATCH)

## 2018-07-01 MED ORDER — SODIUM CHLORIDE 0.9 % IV BOLUS
500.0000 mL | Freq: Once | INTRAVENOUS | Status: AC
  Administered 2018-07-01: 500 mL via INTRAVENOUS

## 2018-07-01 MED ORDER — SODIUM CHLORIDE 0.9% IV SOLUTION
Freq: Once | INTRAVENOUS | Status: AC
  Administered 2018-07-01: 13:00:00 via INTRAVENOUS

## 2018-07-01 MED ORDER — ENSURE ENLIVE PO LIQD
237.0000 mL | Freq: Two times a day (BID) | ORAL | Status: DC
  Administered 2018-07-02 – 2018-07-03 (×2): 237 mL via ORAL

## 2018-07-01 NOTE — Progress Notes (Signed)
CRITICAL VALUE ALERT  Critical Value:  Hgb 6.4  Date & Time Notied:  07/01/2018 0845  Provider Notified:  Jeanella Anton MD [phone]  Orders Received/Actions taken: orders placed

## 2018-07-01 NOTE — Social Work (Addendum)
12:06pm- plan is for pt to return to ALF [facility] at [facility]. Pt will need therapy recommendations on discharge summary. Updated FL2 with preferred level of care.   8:31am- CSW acknowledging consult for SNF placement. Pt is a resident at [facility]. Will follow for therapy recommendations.   Octavio Graves, MSW, [facility] Clinical Social Work [phone]

## 2018-07-01 NOTE — Progress Notes (Signed)
[facility] PROGRESS NOTE  Rachel Barry ZOX:[id number] DOB: October 08, 1915 DOA: 06/29/2018   PCP: System, Pcp Not In     Assessment/Plan: Principal Problem:   Hip fracture, right, closed, initial encounter (HCC) Active Problems:   Seizure (HCC)   Essential hypertension   Normochromic normocytic anemia   Hip fracture (HCC)    [age] y.o. female with history of hypertension, seizures, hypothyroidism, dementia, anemia was brought to the ER after patient had a fall at her living facility.  Patient stood up from the commode and missed a balance and fell onto the side and hit her head in the process onto the commode. x-rays of the hip showed right hip fracture. Dr. Roda ShuttersXu orthopedic surgeon has been consulted.  Assessment and plan 1. Right hip fracture status post mechanical fall -given the patient's age and comorbidities patient  Deemed to be high risk for intermediate risk procedure.  Patient is now status post  Intramedullary implant by Dr. Roda ShuttersXu on 1/9. Currently on Lovenox for DVT prophylaxis 2. Hypertension on amlodipine. 3. Normocytic normochromic anemia  Hemoglobin dropped to 6.4 today. Came in at 8.8. No previous hemoglobin for comparison in the last 3 years. She is now receiving 2 units of packed red blood cells. Anemia panel consistent with iron deficiency anemia given her advanced age she is not a candidate for invasive workup. Will check stool guaiac and follow. Recheck CBC tomorrow morning..  4. History of seizures on Keppra. 5. Hypothyroidism on Synthroid. 6. Dementia on Namenda. 7. Anxiety on Prozac and BuSpar.     DVT prophylaxsis  TO BE INITIATED AFTER SURGERY BY ORTHOPEDICS   Code Status:  Do not resuscitate    Family Communication: Discussed in detail with the patient, all imaging results, lab results explained to the patient /daughter  Disposition Plan:   Anticipate discharge to SNF tomorrow if hemoglobin stable      Consultants: N. Glee ArvinMichael Xu,  MD  Procedures:  none  Antibiotics: Anti-infectives (From admission, onward)   Start     Dose/Rate Route Frequency Ordered Stop   07/01/18 0000  ceFAZolin (ANCEF) IVPB 2g/100 mL premix     2 g 200 mL/hr over 30 Minutes Intravenous Every 6 hours 06/30/18 2054 07/01/18 1216   06/30/18 0730  ceFAZolin (ANCEF) IVPB 2g/100 mL premix     2 g 200 mL/hr over 30 Minutes Intravenous On call to O.R. 06/30/18 0728 06/30/18 1820         HPI/Subjective: Hemoglobin dropped to 6.4 today, she denies any cp,sob  Objective: Vitals:   06/30/18 2300 07/01/18 0300 07/01/18 0734 07/01/18 1137  BP:  (!) 124/56 124/72 (!) 99/53  Pulse:  85 78 71  Resp:   17 15  Temp: 98.5 F (36.9 C) 97.9 F (36.6 C) 100 F (37.8 C) 98.8 F (37.1 C)  TempSrc: Axillary Axillary Axillary Oral  SpO2:  98% 99% 90%    Intake/Output Summary (Last 24 hours) at 07/01/2018 1231 Last data filed at 07/01/2018 0800 Gross per 24 hour  Intake 1972.25 ml  Output 600 ml  Net 1372.25 ml    Exam:  Examination:  General exam: Appears calm and comfortable  Respiratory system: Clear to auscultation. Respiratory effort normal. Cardiovascular system: S1 & S2 heard, RRR. No JVD, murmurs, rubs, gallops or clicks. No pedal edema. Gastrointestinal system: Abdomen is nondistended, soft and nontender. No organomegaly or masses felt. Normal bowel sounds heard. Central nervous system: Alert and oriented. No focal neurological deficits. Extremities: Symmetric 5  x 5 power. Skin: No rashes, lesions or ulcers Psychiatry: Judgement and insight appear normal. Mood & affect appropriate.     Data Reviewed: I have personally reviewed following labs and imaging studies  Micro Results No results found for this or any previous visit (from the past 240 hour(s)).  Radiology Reports Dg Chest 1 View  Result Date: 06/30/2018 CLINICAL DATA:  Initial evaluation for acute trauma, hip fracture. EXAM: CHEST  1 VIEW COMPARISON:  Prior  radiograph from 07/22/2016. FINDINGS: Cardiomegaly, stable. Mediastinal silhouette within normal limits. Aortic atherosclerosis. Lungs somewhat hyperinflated with changes related to underlying COPD. No focal infiltrates. No edema or effusion. No appreciable pneumothorax on this supine view the chest. Diffuse osteopenia.  No acute osseous abnormality. IMPRESSION: 1. No active cardiopulmonary disease. 2. Underlying COPD. 3. Stable cardiomegaly without edema. 4. Aortic atherosclerosis. Electronically Signed   By: Rise Mu M.D.   On: 06/30/2018 00:55   Dg Tibia/fibula Left  Result Date: 06/30/2018 CLINICAL DATA:  Initial evaluation for acute trauma, fall. EXAM: LEFT TIBIA AND FIBULA - 2 VIEW COMPARISON:  None. FINDINGS: No acute fracture or dislocation. Diffuse osteopenia noted. No acute soft tissue abnormality. Vascular calcifications noted within the visualized leg. Degenerative osteoarthrosis with chondrocalcinosis noted at the knee. IMPRESSION: No acute osseous abnormality about the left tibia/fibula. Electronically Signed   By: Rise Mu M.D.   On: 06/30/2018 00:58   Ct Head Wo Contrast  Result Date: 06/30/2018 CLINICAL DATA:  Head trauma, minor, pt on anticoagulation; Head trauma, minor, GCS>=13, high clinical risk, initial exam. One [age] year old post fall this afternoon. EXAM: CT HEAD WITHOUT CONTRAST CT CERVICAL SPINE WITHOUT CONTRAST TECHNIQUE: Multidetector CT imaging of the head and cervical spine was performed following the standard protocol without intravenous contrast. Multiplanar CT image reconstructions of the cervical spine were also generated. COMPARISON:  CT 07/22/2016 FINDINGS: CT HEAD FINDINGS Brain: Unchanged atrophy and chronic small vessel ischemia. No intracranial hemorrhage, mass effect, or midline shift. No hydrocephalus. The basilar cisterns are patent. No evidence of territorial infarct or acute ischemia. No extra-axial or intracranial fluid collection.  Vascular: Atherosclerosis of skullbase vasculature without hyperdense vessel or abnormal calcification. Skull: No fracture or focal lesion. Sinuses/Orbits: Postsurgical change of the paranasal sinuses without acute inflammation. Mastoid air cells are clear. No acute orbital abnormality. Other: None. CT CERVICAL SPINE FINDINGS Patient had difficulty tolerating the exam, there is motion artifact. Alignment: No traumatic subluxation. Minimal anterolisthesis of C4 on C5 and C7 on T1, chronic and likely facet mediated. Skull base and vertebrae: No evidence of fracture, motion limits detailed assessment. The vertebral body heights are preserved. Degenerative change at C1-C2 again seen. Soft tissues and spinal canal: No prevertebral fluid or swelling. No visible canal hematoma. Disc levels: Disc space narrowing and endplate spurring, most prominent at C6-C7, unchanged from prior exam. Multilevel facet arthropathy. Upper chest: Biapical pleuroparenchymal scarring. No acute findings. Other: Carotid calcifications. IMPRESSION: 1. No acute intracranial abnormality. No skull fracture. Stable atrophy and chronic small vessel ischemia. 2. Motion limited evaluation of the cervical spine. No evidence of acute fracture. Degenerative change is grossly stable from 2018. Electronically Signed   By: Narda Rutherford M.D.   On: 06/30/2018 00:36   Ct Cervical Spine Wo Contrast  Result Date: 06/30/2018 CLINICAL DATA:  Head trauma, minor, pt on anticoagulation; Head trauma, minor, GCS>=13, high clinical risk, initial exam. One [age] year old post fall this afternoon. EXAM: CT HEAD WITHOUT CONTRAST CT CERVICAL SPINE WITHOUT CONTRAST TECHNIQUE: Multidetector CT imaging  of the head and cervical spine was performed following the standard protocol without intravenous contrast. Multiplanar CT image reconstructions of the cervical spine were also generated. COMPARISON:  CT 07/22/2016 FINDINGS: CT HEAD FINDINGS Brain: Unchanged atrophy and  chronic small vessel ischemia. No intracranial hemorrhage, mass effect, or midline shift. No hydrocephalus. The basilar cisterns are patent. No evidence of territorial infarct or acute ischemia. No extra-axial or intracranial fluid collection. Vascular: Atherosclerosis of skullbase vasculature without hyperdense vessel or abnormal calcification. Skull: No fracture or focal lesion. Sinuses/Orbits: Postsurgical change of the paranasal sinuses without acute inflammation. Mastoid air cells are clear. No acute orbital abnormality. Other: None. CT CERVICAL SPINE FINDINGS Patient had difficulty tolerating the exam, there is motion artifact. Alignment: No traumatic subluxation. Minimal anterolisthesis of C4 on C5 and C7 on T1, chronic and likely facet mediated. Skull base and vertebrae: No evidence of fracture, motion limits detailed assessment. The vertebral body heights are preserved. Degenerative change at C1-C2 again seen. Soft tissues and spinal canal: No prevertebral fluid or swelling. No visible canal hematoma. Disc levels: Disc space narrowing and endplate spurring, most prominent at C6-C7, unchanged from prior exam. Multilevel facet arthropathy. Upper chest: Biapical pleuroparenchymal scarring. No acute findings. Other: Carotid calcifications. IMPRESSION: 1. No acute intracranial abnormality. No skull fracture. Stable atrophy and chronic small vessel ischemia. 2. Motion limited evaluation of the cervical spine. No evidence of acute fracture. Degenerative change is grossly stable from 2018. Electronically Signed   By: Narda Rutherford M.D.   On: 06/30/2018 00:36   Dg Hand Complete Right  Result Date: 06/30/2018 CLINICAL DATA:  Initial evaluation for acute trauma, fall. EXAM: RIGHT HAND - COMPLETE 3+ VIEW COMPARISON:  None. FINDINGS: No acute fracture or dislocation. Moderate to advanced osteoarthritic changes present throughout the hand, most notable at the right first [facility] joint. Angulation at the right first MCP  and IP joints likely chronic and degenerative. Suspected remotely healed fracture of the mid right fifth metacarpal shaft. No acute soft tissue abnormality. Diffuse osteopenia noted. IMPRESSION: No acute osseous abnormality about the right hand. Electronically Signed   By: Rise Mu M.D.   On: 06/30/2018 00:57   Dg C-arm 1-60 Min  Result Date: 06/30/2018 CLINICAL DATA:  Intramedullary nail fixation of the right femur. EXAM: DG C-ARM 61-120 MIN; RIGHT FEMUR 2 VIEWS COMPARISON:  Pelvis 06/30/2018 FINDINGS: Intraoperative fluoroscopy is obtained for surgical control purposes. Fluoroscopy time is recorded at 1 minute 26 seconds. Since the previous study, there has been interval placement of an intramedullary rod and 2 compression screws across the comminuted inter trochanteric fracture previously demonstrated in the right hip. Fracture fragments demonstrate near-anatomic alignment post fixation. There is a distal locking screw present on the intramedullary rod. Hardware components appear intact. Fracture deformities also incidentally noted in the right inferior and superior pubic rami. Vascular calcifications. Degenerative changes in the right hip and knee. IMPRESSION: Intraoperative fluoroscopy obtained for surgical control purposes demonstrating internal fixation of intertrochanteric fracture of the right hip. Electronically Signed   By: Burman Nieves M.D.   On: 06/30/2018 19:32   Dg Hip Unilat With Pelvis 2-3 Views Right  Result Date: 06/30/2018 CLINICAL DATA:  Initial evaluation for acute trauma, fall. EXAM: DG HIP (WITH OR WITHOUT PELVIS) 2-3V RIGHT COMPARISON:  None. FINDINGS: Complex and comminuted intratrochanteric fracture of the right hip with mild displacement and superior subluxation. Femoral head remains normally position within the acetabulum. Bony pelvis intact. No acute abnormality about the left hip. Diffuse osteopenia noted. Soft tissue  swelling overlies the right hip. IMPRESSION:  Acute comminuted intertrochanteric fracture of the right hip. Electronically Signed   By: Rise MuBenjamin  McClintock M.D.   On: 06/30/2018 00:53   Dg Femur, Min 2 Views Right  Result Date: 06/30/2018 CLINICAL DATA:  Intramedullary nail fixation of the right femur. EXAM: DG C-ARM 61-120 MIN; RIGHT FEMUR 2 VIEWS COMPARISON:  Pelvis 06/30/2018 FINDINGS: Intraoperative fluoroscopy is obtained for surgical control purposes. Fluoroscopy time is recorded at 1 minute 26 seconds. Since the previous study, there has been interval placement of an intramedullary rod and 2 compression screws across the comminuted inter trochanteric fracture previously demonstrated in the right hip. Fracture fragments demonstrate near-anatomic alignment post fixation. There is a distal locking screw present on the intramedullary rod. Hardware components appear intact. Fracture deformities also incidentally noted in the right inferior and superior pubic rami. Vascular calcifications. Degenerative changes in the right hip and knee. IMPRESSION: Intraoperative fluoroscopy obtained for surgical control purposes demonstrating internal fixation of intertrochanteric fracture of the right hip. Electronically Signed   By: Burman NievesWilliam  Stevens M.D.   On: 06/30/2018 19:32     CBC Recent Labs  Lab 06/29/18 2333 06/30/18 0637 07/01/18 0627  WBC 11.9* 9.6 9.7  HGB 8.8* 8.6* 6.4*  HCT 30.5* 28.9* 21.1*  PLT 226 222 174  MCV 88.2 88.4 87.6  MCH 25.4* 26.3 26.6  MCHC 28.9* 29.8* 30.3  RDW 16.0* 16.0* 16.2*  LYMPHSABS 0.5* 0.7  --   MONOABS 0.7 0.7  --   EOSABS 0.0 0.0  --   BASOSABS 0.0 0.0  --     Chemistries  Recent Labs  Lab 06/29/18 2333 06/30/18 0637 07/01/18 0627  NA 137 137 138  K 3.5 3.5 3.5  CL 105 102 105  CO2 22 24 21*  GLUCOSE 136* 119* 132*  BUN 18 21 31*  CREATININE 0.74 0.78 1.20*  CALCIUM 8.6* 8.5* 7.9*  AST  --   --  26  ALT  --   --  19  ALKPHOS  --   --  58  BILITOT  --   --  0.6    ------------------------------------------------------------------------------------------------------------------ CrCl cannot be calculated (Unknown ideal weight.). ------------------------------------------------------------------------------------------------------------------ No results for input(s): HGBA1C in the last 72 hours. ------------------------------------------------------------------------------------------------------------------ No results for input(s): CHOL, HDL, LDLCALC, TRIG, CHOLHDL, LDLDIRECT in the last 72 hours. ------------------------------------------------------------------------------------------------------------------ No results for input(s): TSH, T4TOTAL, T3FREE, THYROIDAB in the last 72 hours.  Invalid input(s): FREET3 ------------------------------------------------------------------------------------------------------------------ Recent Labs    06/30/18 0637  VITAMINB12 402  FOLATE 16.9  FERRITIN 36  TIBC 361  IRON 26*  RETICCTPCT 2.3    Coagulation profile Recent Labs  Lab 06/29/18 2333  INR 1.02    No results for input(s): DDIMER in the last 72 hours.  Cardiac Enzymes No results for input(s): CKMB, TROPONINI, MYOGLOBIN in the last 168 hours.  Invalid input(s): CK ------------------------------------------------------------------------------------------------------------------ Invalid input(s): POCBNP   CBG: No results for input(s): GLUCAP in the last 168 hours.     Studies: Dg Chest 1 View  Result Date: 06/30/2018 CLINICAL DATA:  Initial evaluation for acute trauma, hip fracture. EXAM: CHEST  1 VIEW COMPARISON:  Prior radiograph from 07/22/2016. FINDINGS: Cardiomegaly, stable. Mediastinal silhouette within normal limits. Aortic atherosclerosis. Lungs somewhat hyperinflated with changes related to underlying COPD. No focal infiltrates. No edema or effusion. No appreciable pneumothorax on this supine view the chest. Diffuse  osteopenia.  No acute osseous abnormality. IMPRESSION: 1. No active cardiopulmonary disease. 2. Underlying COPD. 3. Stable cardiomegaly without edema. 4. Aortic  atherosclerosis. Electronically Signed   By: Rise Mu M.D.   On: 06/30/2018 00:55   Dg Tibia/fibula Left  Result Date: 06/30/2018 CLINICAL DATA:  Initial evaluation for acute trauma, fall. EXAM: LEFT TIBIA AND FIBULA - 2 VIEW COMPARISON:  None. FINDINGS: No acute fracture or dislocation. Diffuse osteopenia noted. No acute soft tissue abnormality. Vascular calcifications noted within the visualized leg. Degenerative osteoarthrosis with chondrocalcinosis noted at the knee. IMPRESSION: No acute osseous abnormality about the left tibia/fibula. Electronically Signed   By: Rise Mu M.D.   On: 06/30/2018 00:58   Ct Head Wo Contrast  Result Date: 06/30/2018 CLINICAL DATA:  Head trauma, minor, pt on anticoagulation; Head trauma, minor, GCS>=13, high clinical risk, initial exam. One hundred year old post fall this afternoon. EXAM: CT HEAD WITHOUT CONTRAST CT CERVICAL SPINE WITHOUT CONTRAST TECHNIQUE: Multidetector CT imaging of the head and cervical spine was performed following the standard protocol without intravenous contrast. Multiplanar CT image reconstructions of the cervical spine were also generated. COMPARISON:  CT 07/22/2016 FINDINGS: CT HEAD FINDINGS Brain: Unchanged atrophy and chronic small vessel ischemia. No intracranial hemorrhage, mass effect, or midline shift. No hydrocephalus. The basilar cisterns are patent. No evidence of territorial infarct or acute ischemia. No extra-axial or intracranial fluid collection. Vascular: Atherosclerosis of skullbase vasculature without hyperdense vessel or abnormal calcification. Skull: No fracture or focal lesion. Sinuses/Orbits: Postsurgical change of the paranasal sinuses without acute inflammation. Mastoid air cells are clear. No acute orbital abnormality. Other: None. CT CERVICAL  SPINE FINDINGS Patient had difficulty tolerating the exam, there is motion artifact. Alignment: No traumatic subluxation. Minimal anterolisthesis of C4 on C5 and C7 on T1, chronic and likely facet mediated. Skull base and vertebrae: No evidence of fracture, motion limits detailed assessment. The vertebral body heights are preserved. Degenerative change at C1-C2 again seen. Soft tissues and spinal canal: No prevertebral fluid or swelling. No visible canal hematoma. Disc levels: Disc space narrowing and endplate spurring, most prominent at C6-C7, unchanged from prior exam. Multilevel facet arthropathy. Upper chest: Biapical pleuroparenchymal scarring. No acute findings. Other: Carotid calcifications. IMPRESSION: 1. No acute intracranial abnormality. No skull fracture. Stable atrophy and chronic small vessel ischemia. 2. Motion limited evaluation of the cervical spine. No evidence of acute fracture. Degenerative change is grossly stable from 2018. Electronically Signed   By: Narda Rutherford M.D.   On: 06/30/2018 00:36   Ct Cervical Spine Wo Contrast  Result Date: 06/30/2018 CLINICAL DATA:  Head trauma, minor, pt on anticoagulation; Head trauma, minor, GCS>=13, high clinical risk, initial exam. One hundred year old post fall this afternoon. EXAM: CT HEAD WITHOUT CONTRAST CT CERVICAL SPINE WITHOUT CONTRAST TECHNIQUE: Multidetector CT imaging of the head and cervical spine was performed following the standard protocol without intravenous contrast. Multiplanar CT image reconstructions of the cervical spine were also generated. COMPARISON:  CT 07/22/2016 FINDINGS: CT HEAD FINDINGS Brain: Unchanged atrophy and chronic small vessel ischemia. No intracranial hemorrhage, mass effect, or midline shift. No hydrocephalus. The basilar cisterns are patent. No evidence of territorial infarct or acute ischemia. No extra-axial or intracranial fluid collection. Vascular: Atherosclerosis of skullbase vasculature without hyperdense  vessel or abnormal calcification. Skull: No fracture or focal lesion. Sinuses/Orbits: Postsurgical change of the paranasal sinuses without acute inflammation. Mastoid air cells are clear. No acute orbital abnormality. Other: None. CT CERVICAL SPINE FINDINGS Patient had difficulty tolerating the exam, there is motion artifact. Alignment: No traumatic subluxation. Minimal anterolisthesis of C4 on C5 and C7 on T1, chronic and likely facet  mediated. Skull base and vertebrae: No evidence of fracture, motion limits detailed assessment. The vertebral body heights are preserved. Degenerative change at C1-C2 again seen. Soft tissues and spinal canal: No prevertebral fluid or swelling. No visible canal hematoma. Disc levels: Disc space narrowing and endplate spurring, most prominent at C6-C7, unchanged from prior exam. Multilevel facet arthropathy. Upper chest: Biapical pleuroparenchymal scarring. No acute findings. Other: Carotid calcifications. IMPRESSION: 1. No acute intracranial abnormality. No skull fracture. Stable atrophy and chronic small vessel ischemia. 2. Motion limited evaluation of the cervical spine. No evidence of acute fracture. Degenerative change is grossly stable from 2018. Electronically Signed   By: Narda Rutherford M.D.   On: 06/30/2018 00:36   Dg Hand Complete Right  Result Date: 06/30/2018 CLINICAL DATA:  Initial evaluation for acute trauma, fall. EXAM: RIGHT HAND - COMPLETE 3+ VIEW COMPARISON:  None. FINDINGS: No acute fracture or dislocation. Moderate to advanced osteoarthritic changes present throughout the hand, most notable at the right first [facility] joint. Angulation at the right first MCP and IP joints likely chronic and degenerative. Suspected remotely healed fracture of the mid right fifth metacarpal shaft. No acute soft tissue abnormality. Diffuse osteopenia noted. IMPRESSION: No acute osseous abnormality about the right hand. Electronically Signed   By: Rise Mu M.D.   On:  06/30/2018 00:57   Dg C-arm 1-60 Min  Result Date: 06/30/2018 CLINICAL DATA:  Intramedullary nail fixation of the right femur. EXAM: DG C-ARM 61-120 MIN; RIGHT FEMUR 2 VIEWS COMPARISON:  Pelvis 06/30/2018 FINDINGS: Intraoperative fluoroscopy is obtained for surgical control purposes. Fluoroscopy time is recorded at 1 minute 26 seconds. Since the previous study, there has been interval placement of an intramedullary rod and 2 compression screws across the comminuted inter trochanteric fracture previously demonstrated in the right hip. Fracture fragments demonstrate near-anatomic alignment post fixation. There is a distal locking screw present on the intramedullary rod. Hardware components appear intact. Fracture deformities also incidentally noted in the right inferior and superior pubic rami. Vascular calcifications. Degenerative changes in the right hip and knee. IMPRESSION: Intraoperative fluoroscopy obtained for surgical control purposes demonstrating internal fixation of intertrochanteric fracture of the right hip. Electronically Signed   By: Burman Nieves M.D.   On: 06/30/2018 19:32   Dg Hip Unilat With Pelvis 2-3 Views Right  Result Date: 06/30/2018 CLINICAL DATA:  Initial evaluation for acute trauma, fall. EXAM: DG HIP (WITH OR WITHOUT PELVIS) 2-3V RIGHT COMPARISON:  None. FINDINGS: Complex and comminuted intratrochanteric fracture of the right hip with mild displacement and superior subluxation. Femoral head remains normally position within the acetabulum. Bony pelvis intact. No acute abnormality about the left hip. Diffuse osteopenia noted. Soft tissue swelling overlies the right hip. IMPRESSION: Acute comminuted intertrochanteric fracture of the right hip. Electronically Signed   By: Rise Mu M.D.   On: 06/30/2018 00:53   Dg Femur, Min 2 Views Right  Result Date: 06/30/2018 CLINICAL DATA:  Intramedullary nail fixation of the right femur. EXAM: DG C-ARM 61-120 MIN; RIGHT FEMUR 2  VIEWS COMPARISON:  Pelvis 06/30/2018 FINDINGS: Intraoperative fluoroscopy is obtained for surgical control purposes. Fluoroscopy time is recorded at 1 minute 26 seconds. Since the previous study, there has been interval placement of an intramedullary rod and 2 compression screws across the comminuted inter trochanteric fracture previously demonstrated in the right hip. Fracture fragments demonstrate near-anatomic alignment post fixation. There is a distal locking screw present on the intramedullary rod. Hardware components appear intact. Fracture deformities also incidentally noted in the right inferior  and superior pubic rami. Vascular calcifications. Degenerative changes in the right hip and knee. IMPRESSION: Intraoperative fluoroscopy obtained for surgical control purposes demonstrating internal fixation of intertrochanteric fracture of the right hip. Electronically Signed   By: Burman Nieves M.D.   On: 06/30/2018 19:32      No results found for: HGBA1C Lab Results  Component Value Date   CREATININE 1.20 (H) 07/01/2018       Scheduled Meds: . sodium chloride   Intravenous Once  . acetaminophen  500 mg Oral Q6H  . amLODipine  5 mg Oral Daily  . busPIRone  10 mg Oral BID  . docusate sodium  100 mg Oral BID  . dorzolamide-timolol  1 drop Both Eyes BID  . enoxaparin (LOVENOX) injection  40 mg Subcutaneous Q24H  . FLUoxetine  20 mg Oral Daily  . levETIRAcetam  500 mg Oral BID  . levothyroxine  50 mcg Oral Q0600  . memantine  28 mg Oral Daily  . potassium chloride  40 mEq Oral Once   Continuous Infusions: . sodium chloride 75 mL/hr at 07/01/18 1144  . lactated ringers    . methocarbamol (ROBAXIN) IV 500 mg (07/01/18 0442)     LOS: 1 day    Time spent: >30 MINS    Richarda Overlie  [facility] Pager [phone]. If 7PM-7AM, please contact night-coverage at [email or web address], password [facility] 07/01/2018, 12:31 PM  LOS: 1 day

## 2018-07-01 NOTE — Progress Notes (Signed)
Initial Nutrition Assessment  DOCUMENTATION CODES:   Severe malnutrition in context of chronic illness  INTERVENTION:  Provide Ensure Enlive po BID, each supplement provides 350 kcal and 20 grams of protein  Encourage adequate PO intake.   NUTRITION DIAGNOSIS:   Severe Malnutrition related to chronic illness(dementia) as evidenced by severe fat depletion, severe muscle depletion.  GOAL:   Patient will meet greater than or equal to 90% of their needs  MONITOR:   PO intake, Supplement acceptance, Labs, Weight trends, I & O's, Skin  REASON FOR ASSESSMENT:   Consult Hip fracture protocol  ASSESSMENT:   85 y.o. female with history of hypertension, seizures, hypothyroidism, dementia, anemia was brought to the ER after patient had a fall at her living facility. X-rays of the hip showed right hip fracture.  Procedure(1/9): INTRAMEDULLARY (IM) NAIL RIGHT INTERTROCHANTRIC (Right Leg Upper)   Family at bedside during time of visit. Pt nonverbal throughout RD assessment. Family reports pt with no appetite however forces herself to eat at meals with usual intake of at least 2-3 meals a day. Meal completion 25% at breakfast this morning. Family reports pt had disliked Ensure/Boost in the past, however will consuming them if po intake is poor. RD to order Ensure to aid in caloric and protein needs. No height/weight records. Family reports of measurements stated below. Recommend obtaining current height and weight measurements.   Labs and medications reviewed.   NUTRITION - FOCUSED PHYSICAL EXAM:    Most Recent Value  Orbital Region  Severe depletion  Upper Arm Region  Severe depletion  Thoracic and Lumbar Region  Severe depletion  Buccal Region  Severe depletion  Temple Region  Severe depletion  Clavicle Bone Region  Severe depletion  Clavicle and Acromion Bone Region  Severe depletion  Scapular Bone Region  Severe depletion  Dorsal Hand  Unable to assess  Patellar Region   Moderate depletion  Anterior Thigh Region  Moderate depletion  Posterior Calf Region  Unable to assess  Edema (RD Assessment)  None  Hair  Reviewed  Eyes  Unable to assess  Mouth  Unable to assess  Skin  Reviewed  Nails  Unable to assess       Diet Order:   Diet Order            DIET SOFT Room service appropriate? Yes; Fluid consistency: Thin  Diet effective now              EDUCATION NEEDS:   Not appropriate for education at this time  Skin:  Skin Assessment: Skin Integrity Issues: Skin Integrity Issues:: Incisions Incisions: R thigh  Last BM:  Unknown  Height:   Ht Readings from Last 1 Encounters:  No data found for Ht  Family reports 5 feet  Weight:   Wt Readings from Last 1 Encounters:  No data found for Wt  Family reports 89 lbs (40.45 kg)  Ideal Body Weight:  45.45 kg  BMI:  There is no height or weight on file to calculate BMI.  Estimated Nutritional Needs:   Kcal:  1300-1500  Protein:  45-60 grams  Fluid:  >/= 1.5 L/day    Roslyn Smiling, MS, RD, LDN Pager # [phone] After hours/ weekend pager # [phone]

## 2018-07-01 NOTE — Evaluation (Signed)
Physical Therapy Evaluation Patient Details Name: Rachel Barry MRN: [id number] DOB: 1916-01-28 Today's Date: 07/01/2018   History of Present Illness  85 yo admitted after fall toileting with Rt hip fx s/p IM nail. PMx: HTN, Sz, hypothyroidism, dementia, anemia   Clinical Impression  Pt lethargic on arrival but able to arouse to name. Pt stating "what" to most questions. Pt alert sitting eOb but unable to state needs. Transitioned pt to [facility] and back with pt not providing significant assist for mobility. Pt positioned in bed with pillows and RN present. Pt with cognitive impairments at baseline and no caregivers present with decreased strength and balance who will benefit from trial of acute therapy to maximize mobility, function and safety to decrease burden of care. Recommend rOM and repositioning daily with nursing.     Follow Up Recommendations SNF    Equipment Recommendations  None recommended by PT    Recommendations for Other Services       Precautions / Restrictions Precautions Precautions: Fall Restrictions Weight Bearing Restrictions: No RLE Weight Bearing: Weight bearing as tolerated      Mobility  Bed Mobility Overal bed mobility: Needs Assistance Bed Mobility: Sit to Supine;Sidelying to Sit   Sidelying to sit: Total assist   Sit to supine: Max assist;+2 for physical assistance   General bed mobility comments: total assist to rise from side to sitting with pt mod assist for balance in sitting due to posterior left lean. Max +2 assist to return to supine to control trunk and legs  Transfers Overall transfer level: Needs assistance Equipment used: 2 person hand held assist Transfers: Sit to/from Stand Sit to Stand: Total assist;+2 physical assistance Stand pivot transfers: Total assist;+2 physical assistance       General transfer comment: pt not assisting with transfer bed<>BSC  Ambulation/Gait             General Gait Details: unable  Stairs             Wheelchair Mobility    Modified Rankin (Stroke Patients Only)       Balance Overall balance assessment: Needs assistance Sitting-balance support: No upper extremity supported;Feet supported Sitting balance-Leahy Scale: Poor Sitting balance - Comments: mod assist for balance in sitting Postural control: Left lateral lean;Posterior lean Standing balance support: Bilateral upper extremity supported Standing balance-Leahy Scale: Zero Standing balance comment: sit>stand with +2 A                             Pertinent Vitals/Pain Pain Assessment: Faces Faces Pain Scale: Hurts even more Pain Location: RLE Pain Descriptors / Indicators: Grimacing;Moaning;Guarding Pain Intervention(s): Limited activity within patient's tolerance;Monitored during session;Repositioned    Home Living Family/patient expects to be discharged to:: Skilled nursing facility                 Additional Comments: Pt unable to tell us any history due to lethargy    Prior Function           Comments: Pt unable to tell us any history due to lethargy. per chart walked with walker     Hand Dominance        Extremity/Trunk Assessment   Upper Extremity Assessment Upper Extremity Assessment: Defer to OT evaluation    Lower Extremity Assessment Lower Extremity Assessment: Generalized weakness    Cervical / Trunk Assessment Cervical / Trunk Assessment: Kyphotic  Communication   Communication: (mumbles)  Cognition Arousal/Alertness: Lethargic Behavior During Therapy: Flat  affect Overall Cognitive Status: No family/caregiver present to determine baseline cognitive functioning                                 General Comments: Pt with dementia at baseline. Pt able to begin spelling name but otherwise would state "what"      General Comments      Exercises     Assessment/Plan    PT Assessment Patient needs continued PT services  PT Problem List  Decreased strength;Decreased balance;Decreased activity tolerance;Decreased mobility;Decreased cognition;Pain;Decreased safety awareness       PT Treatment Interventions DME instruction;Functional mobility training;Balance training;Patient/family education;Therapeutic activities;Therapeutic exercise    PT Goals (Current goals can be found in the Care Plan section)  Acute Rehab PT Goals Patient Stated Goal: unable to state PT Goal Formulation: Patient unable to participate in goal setting Time For Goal Achievement: 07/15/18 Potential to Achieve Goals: Poor    Frequency Min 2X/week   Barriers to discharge        Co-evaluation   Reason for Co-Treatment: For patient/therapist safety;To address functional/ADL transfers   OT goals addressed during session: ADL's and self-care;Strengthening/ROM       AM-PAC PT "6 Clicks" Mobility  Outcome Measure Help needed turning from your back to your side while in a flat bed without using bedrails?: Total Help needed moving from lying on your back to sitting on the side of a flat bed without using bedrails?: Total Help needed moving to and from a bed to a chair (including a wheelchair)?: Total Help needed standing up from a chair using your arms (e.g., wheelchair or bedside chair)?: Total Help needed to walk in hospital room?: Total Help needed climbing 3-5 steps with a railing? : Total 6 Click Score: 6    End of Session Equipment Utilized During Treatment: Gait belt Activity Tolerance: Patient tolerated treatment well Patient left: in bed;with call bell/phone within reach;with nursing/sitter in room;with bed alarm set Nurse Communication: Mobility status;Need for lift equipment;Weight bearing status PT Visit Diagnosis: Other abnormalities of gait and mobility (R26.89);Muscle weakness (generalized) (M62.81);Unsteadiness on feet (R26.81);History of falling (Z91.81)    Time: 0240-9735 PT Time Calculation (min) (ACUTE ONLY): 27  min   Charges:   PT Evaluation $PT Eval Moderate Complexity: 1 Mod          Tim Corriher Abner Greenspan, PT Acute Rehabilitation Services Pager: [phone] Office: [phone]   Eathon Valade B Ashyr [place] 07/01/2018, 2:08 PM

## 2018-07-01 NOTE — Progress Notes (Signed)
Subjective:  1 Day Post-Op Procedure(s) (LRB): INTRAMEDULLARY (IM) NAIL RIGHT INTERTROCHANTRIC (Right) Patient reports pain as mild.  Appears to have been in  Moderate pain last night with associated agitation (tried to pull off bandage).  Responded well to robaxin and currently resting comfortably.  Objective: Vital signs in last 24 hours: Temp:  [97.5 F (36.4 C)-100 F (37.8 C)] 100 F (37.8 C) (01/10 0734) Pulse Rate:  [62-85] 78 (01/10 0734) Resp:  [14-18] 17 (01/10 0734) BP: (100-158)/(56-90) 124/72 (01/10 0734) SpO2:  [95 %-100 %] 99 % (01/10 0734)  Intake/Output from previous day: 01/09 0701 - 01/10 0700 In: 700 [I.V.:600; IV Piggyback:100] Out: 600 [Urine:525; Blood:75] Intake/Output this shift: No intake/output data recorded.  Recent Labs    06/29/18 2333 06/30/18 0637  HGB 8.8* 8.6*   Recent Labs    06/29/18 2333 06/30/18 0637  WBC 11.9* 9.6  RBC 3.46* 3.27*  3.27*  HCT 30.5* 28.9*  PLT 226 222   Recent Labs    06/29/18 2333 06/30/18 0637  NA 137 137  K 3.5 3.5  CL 105 102  CO2 22 24  BUN 18 21  CREATININE 0.74 0.78  GLUCOSE 136* 119*  CALCIUM 8.6* 8.5*   Recent Labs    06/29/18 2333  INR 1.02    Sensation intact distally Intact pulses distally Incision: dressing C/D/I No cellulitis present Compartment soft    Assessment/Plan: 1 Day Post-Op Procedure(s) (LRB): INTRAMEDULLARY (IM) NAIL RIGHT INTERTROCHANTRIC (Right) Up with therapy  WBAT RLE ABLA- 8.6 yesterday am.  Anticipate will be much lower today and will likely need blood Will defer ABLA and further medical management to medicine team    Cristie Hem 07/01/2018, 7:53 AM

## 2018-07-01 NOTE — Progress Notes (Signed)
Daughter Bonita Quin at bedside notes that private sitters used at [facility] will be staying with patient 2300-0700 tonight, tomorrow 07/02/18 and Sunday 07/03/18.

## 2018-07-01 NOTE — Evaluation (Signed)
Occupational Therapy Evaluation and Discharge Patient Details Name: Rachel Barry MRN: [id number] DOB: Jan 08, 1916 Today's Date: 07/01/2018    History of Present Illness 85 yo admitted after fall toileting with Rt hip fx s/p IM nail. PMx: HTN, Sz, hypothyroidism, dementia, anemia    Clinical Impression   This 85 yo female admitted and underwent above presents to acute OT with increased pain and decreased mobility all affecting her ability to A with basic ADLs. She will benefit from follow up OT at SNF to work on increasing her ability to A with basic ADLs. We will defer remainder of OT to SNF and sign of acutely.    Follow Up Recommendations  Supervision/Assistance - 24 hour    Equipment Recommendations  Other (comment)(TBD at next venue)       Precautions / Restrictions Precautions Precautions: Fall Restrictions Weight Bearing Restrictions: No RLE Weight Bearing: Weight bearing as tolerated      Mobility Bed Mobility Overal bed mobility: Needs Assistance Bed Mobility: Sidelying to Sit   Sidelying to sit: Total assist          Transfers Overall transfer level: Needs assistance Equipment used: 2 person hand held assist Transfers: Sit to/from Stand;Stand Pivot Transfers Sit to Stand: Total assist;+2 physical assistance Stand pivot transfers: Total assist;+2 physical assistance       General transfer comment: Pt not at all assisting with transfer    Balance Overall balance assessment: Needs assistance Sitting-balance support: No upper extremity supported;Feet supported Sitting balance-Leahy Scale: Zero   Postural control: Left lateral lean;Posterior lean Standing balance support: Bilateral upper extremity supported Standing balance-Leahy Scale: Zero Standing balance comment: sit>stand with +2 A                           ADL either performed or assessed with clinical judgement   ADL                                          General ADL Comments: total A with +2 for any transfers     Vision Baseline Vision/History: (pt unable to state)              Pertinent Vitals/Pain Pain Assessment: Faces Faces Pain Scale: Hurts even more Pain Location: RLE Pain Descriptors / Indicators: Grimacing;Moaning;Guarding Pain Intervention(s): Limited activity within patient's tolerance;Monitored during session;Repositioned;Premedicated before session        Extremity/Trunk Assessment Upper Extremity Assessment Upper Extremity Assessment: Generalized weakness(moves both UEs spontaneously)           Communication Communication Communication: (mumbles)   Cognition Arousal/Alertness: Lethargic Behavior During Therapy: Flat affect Overall Cognitive Status: No family/caregiver present to determine baseline cognitive functioning                                 General Comments: Pt with dementia at baseline              Home Living Family/patient expects to be discharged to:: Skilled nursing facility                                 Additional Comments: Pt unable to tell us any history due to lethargy      Prior Functioning/Environment  Comments: Pt unable to tell us any history due to lethargy        OT Problem List: Decreased strength;Decreased activity tolerance;Impaired balance (sitting and/or standing);Pain;Decreased safety awareness;Decreased cognition;Decreased range of motion         OT Goals(Current goals can be found in the care plan section) Acute Rehab OT Goals Patient Stated Goal: unable to state OT Goal Formulation: Patient unable to participate in goal setting  OT Frequency:             Co-evaluation PT/OT/SLP Co-Evaluation/Treatment: Yes Reason for Co-Treatment: For patient/therapist safety;To address functional/ADL transfers   OT goals addressed during session: ADL's and self-care;Strengthening/ROM      AM-PAC OT "6 Clicks" Daily  Activity     Outcome Measure Help from another person eating meals?: Total Help from another person taking care of personal grooming?: Total Help from another person toileting, which includes using toliet, bedpan, or urinal?: Total Help from another person bathing (including washing, rinsing, drying)?: Total Help from another person to put on and taking off regular upper body clothing?: Total Help from another person to put on and taking off regular lower body clothing?: Total 6 Click Score: 6   End of Session Equipment Utilized During Treatment: Gait belt Nurse Communication: Mobility status([facility]  Activity Tolerance: Patient limited by lethargy Patient left: in bed;with call bell/phone within reach;with bed alarm set;with restraints reapplied(Bil mitss)  OT Visit Diagnosis: Unsteadiness on feet (R26.81);Other abnormalities of gait and mobility (R26.89);Muscle weakness (generalized) (M62.81);Pain;History of falling (Z91.81) Pain - Right/Left: Right Pain - part of body: Leg                Time: [id number] OT Time Calculation (min): 26 min Charges:  OT General Charges $OT Visit: 1 Visit OT Evaluation $OT Eval Moderate Complexity: 1 Mod  Rachel Palmaathy Atlas Barry, OTR/L [facility] Pager [phone] Office [phone]     Rachel Barry, Rachel Barry Rachel Barry 07/01/2018, 11:43 AM

## 2018-07-01 NOTE — NC FL2 (Addendum)
MEDICAID FL2 LEVEL OF CARE SCREENING TOOL     IDENTIFICATION  Patient Name: Rachel Barry Birthdate: May 14, 1916 Sex: female Admission Date (Current Location): 06/29/2018  [facility] [place]   Facility and Address:  [facility] [place], [address], [place], [place] [address]      Provider Number: [id number]  Attending Physician Name and Address:  Rachel OverlieAbrol, Nayana, MD  Relative Name and Phone Number:  Rachel FerrisLaura Barry, daughter, [phone]    Current Level of Care: Hospital Recommended Level of Care: Memory Care ALF  Prior Approval Number:    Date Approved/Denied:   PASRR Number:    [facility]   Current Diagnoses: Patient Active Problem List   Diagnosis Date Noted  . Hip fracture, right, closed, initial encounter (HCC) 06/30/2018  . Seizure (HCC) 06/30/2018  . Essential hypertension 06/30/2018  . Normochromic normocytic anemia 06/30/2018  . Hip fracture (HCC) 06/30/2018    Orientation RESPIRATION BLADDER Height & Weight     Self  O2(nasal canula 2L) Continent Weight:   Height:     BEHAVIORAL SYMPTOMS/MOOD NEUROLOGICAL BOWEL NUTRITION STATUS      Continent Diet(see discharge summary)  AMBULATORY STATUS COMMUNICATION OF NEEDS Skin   Extensive Assist Verbally Surgical wounds(right thigh silicone dressing)                       Personal Care Assistance Level of Assistance  Bathing, Feeding, Dressing Bathing Assistance: Maximum assistance Feeding assistance: Independent Dressing Assistance: Maximum assistance     Functional Limitations Info  Sight, Hearing, Speech Sight Info: Adequate Hearing Info: Adequate Speech Info: Adequate    SPECIAL CARE FACTORS FREQUENCY  PT (By licensed PT), OT (By licensed OT)     PT Frequency: 5x week OT Frequency: 5x week            Contractures Contractures Info: Not present    Additional Factors Info  Code Status, Allergies,  Psychotropic Code Status Info: Rachel Barry Allergies Info: CIPROFLOXACIN HCL  Psychotropic Info: FLUoxetine (PROZAC) capsule 20 mg daily PO; busPIRone (BUSPAR) tablet 10 mg 2x daily PO; levETIRAcetam (KEPPRA) tablet 500 mg 2x daily PO; memantine (NAMENDA XR) 24 hr capsule 28 mg daily PO         Current Medications (07/01/2018):  This is the current hospital active medication list Current Facility-Administered Medications  Medication Dose Route Frequency Provider Last Rate Last Dose  . 0.9 %  sodium chloride infusion (Manually program via Guardrails IV Fluids)   Intravenous Once Rachel OverlieAbrol, Nayana, MD      . 0.9 %  sodium chloride infusion   Intravenous Continuous Rachel KosXu, Rachel M, MD 75 mL/hr at 06/30/18 1947    . acetaminophen (TYLENOL) tablet 325-650 mg  325-650 mg Oral Q6H PRN Rachel KosXu, Rachel M, MD      . acetaminophen (TYLENOL) tablet 500 mg  500 mg Oral Q6H Rachel KosXu, Rachel M, MD      . alum & mag hydroxide-simeth (MAALOX/MYLANTA) 200-200-20 MG/5ML suspension 30 mL  30 mL Oral Q4H PRN Rachel KosXu, Rachel M, MD      . amLODipine (NORVASC) tablet 5 mg  5 mg Oral Daily Rachel KosXu, Rachel M, MD   5 mg at 07/01/18 0940  . busPIRone (BUSPAR) tablet 10 mg  10 mg Oral BID Rachel KosXu, Rachel M, MD   10 mg at 07/01/18 0940  . ceFAZolin (ANCEF) IVPB 2g/100 mL premix  2 g Intravenous Q6H Rachel KosXu, Rachel M, MD 200  mL/hr at 07/01/18 0643 2 g at 07/01/18 0643  . docusate sodium (COLACE) capsule 100 mg  100 mg Oral BID Rachel Kos, MD      . dorzolamide-timolol (COSOPT) 22.3-6.8 MG/ML ophthalmic solution 1 drop  1 drop Both Eyes BID Rachel Kos, MD   1 drop at 07/01/18 0949  . enoxaparin (LOVENOX) injection 40 mg  40 mg Subcutaneous Q24H Rachel Kos, MD   40 mg at 07/01/18 0800  . FLUoxetine (PROZAC) capsule 20 mg  20 mg Oral Daily Rachel Kos, MD   20 mg at 07/01/18 0940  . HYDROcodone-acetaminophen (NORCO) 7.5-325 MG per tablet 1-2 tablet  1-2 tablet Oral Q4H PRN Rachel Kos, MD      . HYDROcodone-acetaminophen (NORCO/VICODIN) 5-325 MG per  tablet 1-2 tablet  1-2 tablet Oral Q4H PRN Rachel Kos, MD   1 tablet at 07/01/18 0940  . lactated ringers infusion   Intravenous Continuous Rachel Kos, MD      . levETIRAcetam (KEPPRA) tablet 500 mg  500 mg Oral BID Rachel Kos, MD   500 mg at 07/01/18 0940  . levothyroxine (SYNTHROID, LEVOTHROID) tablet 50 mcg  50 mcg Oral Q0600 Rachel Kos, MD      . magnesium citrate solution 1 Bottle  1 Bottle Oral Once PRN Rachel Kos, MD      . memantine (NAMENDA XR) 24 hr capsule 28 mg  28 mg Oral Daily Rachel Kos, MD   28 mg at 07/01/18 0940  . menthol-cetylpyridinium (CEPACOL) lozenge 3 mg  1 lozenge Oral PRN Rachel Kos, MD       Or  . phenol (CHLORASEPTIC) mouth spray 1 spray  1 spray Mouth/Throat PRN Rachel Kos, MD      . methocarbamol (ROBAXIN) tablet 500 mg  500 mg Oral Q6H PRN Rachel Kos, MD       Or  . methocarbamol (ROBAXIN) 500 mg in dextrose 5 % 50 mL IVPB  500 mg Intravenous Q6H PRN Rachel Kos, MD 100 mL/hr at 07/01/18 0442 500 mg at 07/01/18 0442  . morphine 2 MG/ML injection 1 mg  1 mg Intravenous Q2H PRN Rachel Kos, MD   1 mg at 07/01/18 0355  . ondansetron (ZOFRAN) tablet 4 mg  4 mg Oral Q6H PRN Rachel Kos, MD       Or  . ondansetron [facility]) injection 4 mg  4 mg Intravenous Q6H PRN Rachel Kos, MD      . polyethylene glycol (MIRALAX / GLYCOLAX) packet 17 g  17 g Oral Daily PRN Rachel Kos, MD      . potassium chloride SA (K-DUR,KLOR-CON) CR tablet 40 mEq  40 mEq Oral Once Rachel Kos, MD      . sorbitol 70 % solution 30 mL  30 mL Oral Daily PRN Rachel Kos, MD         Discharge Medications: Please see discharge summary for a list of discharge medications.  Relevant Imaging Results:  Relevant Lab Results:   Additional Information SS#[id number] [place], [place]

## 2018-07-01 NOTE — Clinical Social Work Note (Signed)
Clinical Social Work Assessment  Patient Details  Name: Rachel Barry MRN: [id number] Date of Birth: 02/04/1916  Date of referral:  07/01/18               Reason for consult:  Facility Placement                Permission sought to share information with:  Family Supports, Customer service manager Permission granted to share information::  No, pt disoriented  Name::      Ninfa Meeker   Agency::   [facility]   Relationship::   daughter  Contact Information:   [phone]  Housing/Transportation Living arrangements for the past 2 months:  [place] of Information:  Adult Children Patient Interpreter Needed:  None Criminal Activity/Legal Involvement Pertinent to Current Situation/Hospitalization:  No - Comment as needed Significant Relationships:  Adult Children, Other Family Members, Community Support Lives with:  Facility Resident Do you feel safe going back to the place where you live?  Yes Need for family participation in patient care:  Yes (Comment)  Care giving concerns: Pt from [facility]. Plan is to return with therapy when medically appropriate. The pt family prefers this as she has sitter services and more supervision in that setting.   Social Worker assessment / plan:  CSW met with pt daughter at bedside. Introduced self, role, and reason for visit. Pt daughter confirms pt is from [facility] where she has 24/7 supervision and sitter services. Pt had surgical repair of her hip and at discharge pt family prefers that she return to her previous settings to help lessen confusion. Pt will still receive therapy at [facility]. Pt daughter states that her brother is POA  (Dr. Ferol Luz [phone]), but that she lives closest and is able to be contacted should any needs arise.  Will follow for medical stability and discharge.   Employment status:  Retired Forensic scientist:  [employer] PT Recommendations:  [address] / Referral to community resources:  [place]  Patient/Family's Response to care:  Pt states understanding of CSW role and reason for visit. Pt daughter and family prefer for pt to return to previous setting to aide in lessening potential confusion and stress.   Patient/Family's Understanding of and Emotional Response to Diagnosis, Current Treatment, and Prognosis:  Pt daughter states understanding of diagnosis, current treatment and prognosis. Pt daughter had some questions regarding to pain regimen and CSW encouraged pt daughter to request AVS copy, or to call hospital medical records for discharge summary for her and her brother's records. Pt daughter pleasant and emotionally appropriate throughout assessment, she appears happy with care that her mother is receiving here at hospital.  Emotional Assessment Appearance:  Appears stated age Attitude/Demeanor/Rapport:  Unable to Assess Affect (typically observed):  Unable to Assess Orientation:  Oriented to Self, Fluctuating Orientation (Suspected and/or reported Sundowners) Alcohol / Substance use:  Not Applicable Psych involvement (Current and /or in the community):  No (Comment)  Discharge Needs  Concerns to be addressed:  Cognitive Concerns, Care Coordination Readmission within the last 30 days:  No Current discharge risk:  Cognitively Impaired, Dependent with Mobility Barriers to Discharge:  Continued Medical Work up   [employer], [place] 07/01/2018, 12:24 PM

## 2018-07-01 NOTE — Progress Notes (Deleted)
PT Cancellation Note  Patient Details Name: Rachel Barry MRN: [id number] DOB: 09/08/1915   Cancelled Treatment:    Chart reviewed in error as patient already seen by another PT earlier this date. Please disregard.    1:36 PM, 07/01/18 Rachel Barry, PT, DPT Physical Therapist - [place] [phone] (Pager)  [phone] (Office)      Rachel Barry C 07/01/2018, 1:36 PM

## 2018-07-01 NOTE — Anesthesia Postprocedure Evaluation (Signed)
Anesthesia Post Note  Patient: Rachel Barry  Procedure(s) Performed: INTRAMEDULLARY (IM) NAIL RIGHT INTERTROCHANTRIC (Right Leg Upper)     Patient location during evaluation: PACU Anesthesia Type: General Level of consciousness: awake Pain management: pain level controlled Vital Signs Assessment: post-procedure vital signs reviewed and stable Respiratory status: spontaneous breathing Cardiovascular status: stable Postop Assessment: no headache Anesthetic complications: no    Last Vitals:  Vitals:   07/01/18 1137 07/01/18 1235  BP: (!) 99/53 (!) 110/53  Pulse: 71 75  Resp: 15 16  Temp: 37.1 C 36.7 C  SpO2: 90% 99%    Last Pain:  Vitals:   07/01/18 1235  TempSrc: Axillary  PainSc:                  [name]

## 2018-07-02 DIAGNOSIS — E43 Unspecified severe protein-calorie malnutrition: Secondary | ICD-10-CM

## 2018-07-02 LAB — TYPE AND SCREEN
ABO/RH(D): O POS
Antibody Screen: NEGATIVE
Unit division: 0
Unit division: 0

## 2018-07-02 LAB — BASIC METABOLIC PANEL
Anion gap: 10 (ref 5–15)
BUN: 27 mg/dL — ABNORMAL HIGH (ref 8–23)
CO2: 23 mmol/L (ref 22–32)
Calcium: 8.3 mg/dL — ABNORMAL LOW (ref 8.9–10.3)
Chloride: 110 mmol/L (ref 98–111)
Creatinine, Ser: 0.8 mg/dL (ref 0.44–1.00)
GFR calc Af Amer: >60 mL/min (ref >60)
GFR calc non Af Amer: >60 mL/min (ref >60)
Glucose, Bld: 110 mg/dL — ABNORMAL HIGH (ref 70–99)
Potassium: 4 mmol/L (ref 3.5–5.1)
Sodium: 143 mmol/L (ref 135–145)

## 2018-07-02 LAB — CBC
HCT: 32.5 % — ABNORMAL LOW (ref 36.0–46.0)
Hemoglobin: 10.3 g/dL — ABNORMAL LOW (ref 12.0–15.0)
MCH: 28 pg (ref 26.0–34.0)
MCHC: 31.7 g/dL (ref 30.0–36.0)
MCV: 88.3 fL (ref 80.0–100.0)
Platelets: 152 10*3/uL (ref 150–400)
RBC: 3.68 MIL/uL — AB (ref 3.87–5.11)
RDW: 15.7 % — ABNORMAL HIGH (ref 11.5–15.5)
WBC: 10.2 10*3/uL (ref 4.0–10.5)
nRBC: 0 % (ref 0.0–0.2)

## 2018-07-02 LAB — BPAM RBC
Blood Product Expiration Date: 202002062359
Blood Product Expiration Date: 202002092359
ISSUE DATE / TIME: 202001101244
ISSUE DATE / TIME: 202001101541
Unit Type and Rh: 5100
Unit Type and Rh: 5100

## 2018-07-02 MED ORDER — FERROUS SULFATE 325 (65 FE) MG PO TABS
325.0000 mg | ORAL_TABLET | Freq: Two times a day (BID) | ORAL | Status: DC
  Administered 2018-07-03: 325 mg via ORAL
  Filled 2018-07-02: qty 1

## 2018-07-02 NOTE — Progress Notes (Signed)
PROGRESS NOTE    Rachel Barry  WVP:[id number] DOB: 04-21-1916 DOA: 06/29/2018 PCP: System, Pcp Not In   Brief Narrative:  85 year old female with history of essential hypertension, seizure disorder, hypothyroidism, dementia and anemia came to the hospital.  X-ray showed right-sided hip fracture and she was taken for ORIF on 1/9.  She tolerated the procedure well.  Physical therapy recommended skilled nursing facility.  She was noted to have significant drop in blood pressure which is combination of likely iron deficiency anemia and also hydration.  Iron panel showed significant Lee low ferritin and iron saturation.  She was given transfusion.   Assessment & Plan:   Principal Problem:   Hip fracture, right, closed, initial encounter [facility]) Active Problems:   Seizure (HCC)   Essential hypertension   Normochromic normocytic anemia   Hip fracture (HCC)  Iron deficiency anemia - Initial hemoglobin 6.4, after tenets of PRBC transfusion it has improved and sustained around 9.  No obvious signs of blood loss.  We will hold off on any aggressive intervention at this time and closely monitor hemoglobin levels. -Iron supplements, bowel regimen. - Supportive care.  Right hip fracture status post ORIF 1/9 -PT/OT recommended skilled nursing facility, will make arrangements - Pain control, bowel regimen -Lovenox for DVT prophylaxis -Follow-up outpatient with orthopedic in 2 weeks I have advised nursing staff to closely monitor her to make sure she has daily soft bowel movement and also monitor for any urinary retention. -We will give her incentive spirometry to use whenever she is awake  Essential hypertension -Continue home Norvasc 5 mg daily  History of seizure disorder -Continue home Keppra 5 mg twice daily  Hypothyroidism -Continue Synthroid 50 mcg  History of dementia -Continue Namenda  Severe protein calorie malnutrition -Dietitian consult placed.   DVT prophylaxis:  Lovenox Code Status: DNR Family Communication: None at bedside Disposition Plan: Maintain inpatient stay until she has safe disposition in place and her hemoglobin remained stable  Consultants:   Orthopedic  Procedures:   ORIF 1/9  Antimicrobials:   None   Subjective: Patient is resting well during my assessment.  She does not have any complaints.  She was able to eat her breakfast with the assistance by the nurses this morning.  Review of Systems Otherwise negative except as per HPI, including: General: Denies fever, chills, night sweats or unintended weight loss. Resp: Denies cough, wheezing, shortness of breath. Cardiac: Denies chest pain, palpitations, orthopnea, paroxysmal nocturnal dyspnea. GI: Denies abdominal pain, nausea, vomiting, diarrhea or constipation GU: Denies dysuria, frequency, hesitancy or incontinence MS: Denies muscle aches, joint pain or swelling Neuro: Denies headache, neurologic deficits (focal weakness, numbness, tingling), abnormal gait Psych: Denies anxiety, depression, SI/HI/AVH Skin: Denies new rashes or lesions ID: Denies sick contacts, exotic exposures, travel  Objective: Vitals:   07/01/18 1824 07/01/18 2026 07/02/18 0309 07/02/18 1153  BP: (!) 150/69 127/68 105/87 (!) 141/76  Pulse: 86 83 73 76  Resp: 20 17 15  (!) 25  Temp: 97.8 F (36.6 C) 98.2 F (36.8 C) 98.3 F (36.8 C) 97.9 F (36.6 C)  TempSrc: Axillary Oral Axillary Oral  SpO2: 95% 94% 92% 93%    Intake/Output Summary (Last 24 hours) at 07/02/2018 1201 Last data filed at 07/01/2018 2301 Gross per 24 hour  Intake 508.67 ml  Output 50 ml  Net 458.67 ml   There were no vitals filed for this visit.  Examination:  General exam: Appears very elderly, frail and cachectic.  With bilateral temporal wasting. Respiratory system: Diminished  breath sounds at the bases secondary to poor respiratory effort Cardiovascular system: S1 & S2 heard, RRR. No JVD, murmurs, rubs, gallops or  clicks. No pedal edema. Gastrointestinal system: Abdomen is nondistended, soft and nontender. No organomegaly or masses felt. Normal bowel sounds heard. Central nervous system: Alert and oriented. No focal neurological deficits. Extremities: Symmetric 4 x 5 power. Skin: No rashes, lesions or ulcers Psychiatry: Judgement and insight appear normal. Mood & affect appropriate.     Data Reviewed:   CBC: Recent Labs  Lab 06/29/18 2333 06/30/18 0637 07/01/18 0627 07/01/18 2128 07/02/18 0616  WBC 11.9* 9.6 9.7  --  10.2  NEUTROABS 10.6* 8.1*  --   --   --   HGB 8.8* 8.6* 6.4* 9.9* 10.3*  HCT 30.5* 28.9* 21.1* 30.8* 32.5*  MCV 88.2 88.4 87.6  --  88.3  PLT 226 222 174  --  152   Basic Metabolic Panel: Recent Labs  Lab 06/29/18 2333 06/30/18 0637 07/01/18 0627 07/02/18 0616  NA 137 137 138 143  K 3.5 3.5 3.5 4.0  CL 105 102 105 110  CO2 22 24 21* 23  GLUCOSE 136* 119* 132* 110*  BUN 18 21 31* 27*  CREATININE 0.74 0.78 1.20* 0.80  CALCIUM 8.6* 8.5* 7.9* 8.3*   GFR: CrCl cannot be calculated (Unknown ideal weight.). Liver Function Tests: Recent Labs  Lab 07/01/18 0627  AST 26  ALT 19  ALKPHOS 58  BILITOT 0.6  PROT 5.3*  ALBUMIN 3.0*   No results for input(s): LIPASE, AMYLASE in the last 168 hours. No results for input(s): AMMONIA in the last 168 hours. Coagulation Profile: Recent Labs  Lab 06/29/18 2333  INR 1.02   Cardiac Enzymes: No results for input(s): CKTOTAL, CKMB, CKMBINDEX, TROPONINI in the last 168 hours. BNP (last 3 results) No results for input(s): PROBNP in the last 8760 hours. HbA1C: No results for input(s): HGBA1C in the last 72 hours. CBG: No results for input(s): GLUCAP in the last 168 hours. Lipid Profile: No results for input(s): CHOL, HDL, LDLCALC, TRIG, CHOLHDL, LDLDIRECT in the last 72 hours. Thyroid Function Tests: No results for input(s): TSH, T4TOTAL, FREET4, T3FREE, THYROIDAB in the last 72 hours. Anemia Panel: Recent Labs     06/30/18 0637  VITAMINB12 402  FOLATE 16.9  FERRITIN 36  TIBC 361  IRON 26*  RETICCTPCT 2.3   Sepsis Labs: No results for input(s): PROCALCITON, LATICACIDVEN in the last 168 hours.  No results found for this or any previous visit (from the past 240 hour(s)).       Radiology Studies: Dg C-arm 1-60 Min  Result Date: 06/30/2018 CLINICAL DATA:  Intramedullary nail fixation of the right femur. EXAM: DG C-ARM 61-120 MIN; RIGHT FEMUR 2 VIEWS COMPARISON:  P[facility]vis 06/30/2018 FINDINGS: Intraoperative fluoroscopy is obtained for surgical control purposes. Fluoroscopy time is recorded at 1 minute 26 seconds. Since the previous study, there has been interval placement of an intramedullary rod and 2 compression screws across the comminuted inter trochanteric fracture previously demonstrated in the right hip. Fracture fragments demonstrate near-anatomic alignment post fixation. There is a distal locking screw present on the intramedullary rod. Hardware components appear intact. Fracture deformities also incidentally noted in the right inferior and superior pubic rami. Vascular calcifications. Degenerative changes in the right hip and knee. IMPRESSION: Intraoperative fluoroscopy obtained for surgical control purposes demonstrating internal fixation of intertrochanteric fracture of the right hip. Electronically Signed   By: Burman NievesWilliam  Stevens M.D.   On: 06/30/2018 19:32  Dg Femur, Min 2 Views Right  Result Date: 06/30/2018 CLINICAL DATA:  Intramedullary nail fixation of the right femur. EXAM: DG C-ARM 61-120 MIN; RIGHT FEMUR 2 VIEWS COMPARISON:  Pelvis 06/30/2018 FINDINGS: Intraoperative fluoroscopy is obtained for surgical control purposes. Fluoroscopy time is recorded at 1 minute 26 seconds. Since the previous study, there has been interval placement of an intramedullary rod and 2 compression screws across the comminuted inter trochanteric fracture previously demonstrated in the right hip. Fracture fragments  demonstrate near-anatomic alignment post fixation. There is a distal locking screw present on the intramedullary rod. Hardware components appear intact. Fracture deformities also incidentally noted in the right inferior and superior pubic rami. Vascular calcifications. Degenerative changes in the right hip and knee. IMPRESSION: Intraoperative fluoroscopy obtained for surgical control purposes demonstrating internal fixation of intertrochanteric fracture of the right hip. Electronically Signed   By: Burman Nieves M.D.   On: 06/30/2018 19:32        Scheduled Meds: . amLODipine  5 mg Oral Daily  . busPIRone  10 mg Oral BID  . docusate sodium  100 mg Oral BID  . dorzolamide-timolol  1 drop Both Eyes BID  . enoxaparin (LOVENOX) injection  40 mg Subcutaneous Q24H  . feeding supplement (ENSURE ENLIVE)  237 mL Oral BID BM  . FLUoxetine  20 mg Oral Daily  . levETIRAcetam  500 mg Oral BID  . levothyroxine  50 mcg Oral Q0600  . memantine  28 mg Oral Daily   Continuous Infusions: . lactated ringers    . methocarbamol (ROBAXIN) IV 500 mg (07/01/18 0442)     LOS: 2 days   Time spent= 35 mins    Ankit Joline Maxcy, MD [facility]  If 7PM-7AM, please contact night-coverage [email or web address] 07/02/2018, 12:01 PM

## 2018-07-02 NOTE — Progress Notes (Signed)
Pt was agitated this shift. She was able to wiggle hands out of mitts caused IV to leak. IV removed from R ac and iv team placed a new one in L upper arm. She had been given hydrocodone and she cheeked them then spit them out about 45 minutes later. Daughter was at bedside during this episode. Pt is currently resting with eyes closed. Mayford Knife, RN.

## 2018-07-02 NOTE — Progress Notes (Signed)
Subjective:  2 Days Post-Op Procedure(s) (LRB): INTRAMEDULLARY (IM) NAIL RIGHT INTERTROCHANTRIC (Right) Resting comfortably in bed  Objective: Vital signs in last 24 hours: Temp:  [97.8 F (36.6 C)-98.8 F (37.1 C)] 98.3 F (36.8 C) (01/11 0309) Pulse Rate:  [71-86] 73 (01/11 0309) Resp:  [15-20] 15 (01/11 0309) BP: (99-150)/(48-87) 105/87 (01/11 0309) SpO2:  [90 %-99 %] 92 % (01/11 0309)  Intake/Output from previous day: 01/10 0701 - 01/11 0700 In: 2063.5 [I.V.:1230.9; Blood:476.7; IV Piggyback:356] Out: 150 [Urine:150] Intake/Output this shift: No intake/output data recorded.  Recent Labs    06/29/18 2333 06/30/18 0637 07/01/18 0627 07/01/18 2128 07/02/18 0616  HGB 8.8* 8.6* 6.4* 9.9* 10.3*   Recent Labs    07/01/18 0627 07/01/18 2128 07/02/18 0616  WBC 9.7  --  10.2  RBC 2.41*  --  3.68*  HCT 21.1* 30.8* 32.5*  PLT 174  --  152   Recent Labs    07/01/18 0627 07/02/18 0616  NA 138 143  K 3.5 4.0  CL 105 110  CO2 21* 23  BUN 31* 27*  CREATININE 1.20* 0.80  GLUCOSE 132* 110*  CALCIUM 7.9* 8.3*   Recent Labs    06/29/18 2333  INR 1.02    Surgical dressings are c/d/i   Assessment/Plan: 2 Days Post-Op Procedure(s) (LRB): INTRAMEDULLARY (IM) NAIL RIGHT INTERTROCHANTRIC (Right) Up with therapy  WBAT RLE Responded well to transfusions' Will need SNF F/u in 2 weeks in office    Rachel Barry 07/02/2018, 9:45 AM

## 2018-07-03 LAB — CBC
HCT: 29.9 % — ABNORMAL LOW (ref 36.0–46.0)
Hemoglobin: 9.7 g/dL — ABNORMAL LOW (ref 12.0–15.0)
MCH: 28.9 pg (ref 26.0–34.0)
MCHC: 32.4 g/dL (ref 30.0–36.0)
MCV: 89 fL (ref 80.0–100.0)
Platelets: 152 10*3/uL (ref 150–400)
RBC: 3.36 MIL/uL — ABNORMAL LOW (ref 3.87–5.11)
RDW: 16.1 % — AB (ref 11.5–15.5)
WBC: 8.5 10*3/uL (ref 4.0–10.5)
nRBC: 0 % (ref 0.0–0.2)

## 2018-07-03 MED ORDER — FERROUS SULFATE 325 (65 FE) MG PO TABS: 325.0000 mg | ORAL_TABLET | Freq: Two times a day (BID) | ORAL | 0 refills | Status: AC

## 2018-07-03 MED ORDER — METHOCARBAMOL 500 MG PO TABS: 500.0000 mg | ORAL_TABLET | Freq: Four times a day (QID) | ORAL | 0 refills | Status: AC | PRN

## 2018-07-03 NOTE — Discharge Summary (Signed)
Physician Discharge Summary  Rachel Barry VHQ:[id number] DOB: 1915-10-21 DOA: 06/29/2018  PCP: System, Pcp Not In  Admit date: 06/29/2018 Discharge date: 07/03/2018  Admitted From: Assisted living Disposition: Assisted living will therapy  Recommendations for Outpatient Follow-up:  1. Follow up with PCP in 1-2 weeks 2. Please obtain BMP/CBC in one week your next doctors visit.  3. Lovenox for 2 weeks subcutaneous, for DVT prophylaxis 4. Follow-up outpatient orthopedic in 2 weeks  Discharge Condition: Stable CODE STATUS: DNR Diet recommendation: 2 g salt  Brief/Interim Summary: 85 year old female with history of essential hypertension, seizure disorder, hypothyroidism, dementia and anemia came to the hospital.  X-ray showed right-sided hip fracture and she was taken for ORIF on 1/9.  She tolerated the procedure well.  Physical therapy recommended skilled nursing facility.  She was noted to have significant drop in blood pressure which is combination of likely iron deficiency anemia and also hydration.  Iron panel showed significantly low ferritin and iron saturation.  She was given transfusion.  Since then her hemoglobin has remained stable around 9-10.0.  She was started on ferrous sulfate to be taken twice daily along with bowel regimen as needed.  Family opted patient to be transition to assisted living facility with therapy over there. During the hospitalization off-and-on patient had poor oral intake. At this point patient has reached maximum benefit from hospital stay and stable for discharge with outpatient follow-up.  Discharge Diagnoses:  Principal Problem:   Hip fracture, right, closed, initial encounter [facility]) Active Problems:   Seizure (HCC)   Essential hypertension   Normochromic normocytic anemia   Hip fracture (HCC)   Protein-calorie malnutrition, severe  Iron deficiency anemia - Initial hemoglobin 6.4, after 2 units of PRBC transfusion it has improved and sustained  around 9.  No obvious signs of blood loss.  We will hold off on any aggressive intervention at this time and closely monitor hemoglobin levels. -Ferrous sulfate twice daily, bowel regimen as needed. - Supportive care.  Right hip fracture status post ORIF 1/9 -PT/OT recommended skilled nursing facility but family prefers assisted living with therapy to be provided over there, will make arrangements - Pain control, bowel regimen -Lovenox for DVT prophylaxis for at least 35 days -Follow-up outpatient with orthopedic in 2 weeks -Encouraged to use incentive spirometry.  Essential hypertension -Continue home Norvasc 5 mg daily  History of seizure disorder -Continue home Keppra 5 mg twice daily  Hypothyroidism -Continue Synthroid 50 mcg  History of dementia -Continue Namenda  Severe protein calorie malnutrition -Dietitian consulted - appreciate input.   Patient on DVT prophylaxis while here Discharge today when the bed is available. Spoke with the patient's daughter over the phone.  Discharge Instructions  Discharge Instructions    Weight bearing as tolerated   Complete by:  As directed      Allergies as of 07/03/2018      Reactions   Ciprofloxacin Hcl Other (See Comments)   Not disclosed on MAR      Medication List    TAKE these medications   acetaminophen 325 MG tablet Commonly known as:  TYLENOL Take 325 mg by mouth 3 (three) times daily. Taking every day per [facility]   acetaminophen 500 MG tablet Commonly known as:  TYLENOL Take 500 mg by mouth daily as needed for moderate pain.   amLODipine 5 MG tablet Commonly known as:  NORVASC Take 5 mg by mouth daily.   aspirin EC 81 MG tablet Take 81 mg by mouth daily.   busPIRone  10 MG tablet Commonly known as:  BUSPAR Take 10 mg by mouth 2 (two) times daily.   calcitRIOL 0.25 MCG capsule Commonly known as:  ROCALTROL Take 0.25 mcg by mouth daily.   CITRACAL + D PO Take 1 tablet by mouth daily.   COSOPT  22.3-6.8 MG/ML ophthalmic solution Generic drug:  dorzolamide-timolol Place 1 drop into both eyes 2 (two) times daily. Preservative Free   docusate sodium 100 MG capsule Commonly known as:  COLACE Take 100 mg by mouth daily.   enoxaparin 40 MG/0.4ML injection Commonly known as:  LOVENOX Inject 0.4 mLs (40 mg total) into the skin daily.   ferrous sulfate 325 (65 FE) MG tablet Take 1 tablet (325 mg total) by mouth 2 (two) times daily with a meal for 30 days.   FLUoxetine 20 MG capsule Commonly known as:  PROZAC Take 20 mg by mouth daily.   guaifenesin 100 MG/5ML syrup Commonly known as:  ROBITUSSIN Take 200 mg by mouth 4 (four) times daily as needed for cough.   HYDROcodone-acetaminophen 5-325 MG tablet Commonly known as:  NORCO/VICODIN Take 1 tablet by mouth every 6 (six) hours as needed for moderate pain.   levETIRAcetam 500 MG tablet Commonly known as:  KEPPRA Take 500 mg by mouth 2 (two) times daily.   levothyroxine 50 MCG tablet Commonly known as:  SYNTHROID, LEVOTHROID Take 50 mcg by mouth daily.   LORazepam 0.5 MG tablet Commonly known as:  ATIVAN Take 0.5 mg by mouth every 6 (six) hours as needed for anxiety.   methocarbamol 500 MG tablet Commonly known as:  ROBAXIN Take 1 tablet (500 mg total) by mouth every 6 (six) hours as needed for up to 20 doses for muscle spasms.   multivitamin with minerals Tabs tablet Take 1 tablet by mouth daily.   NAMENDA XR 28 MG Cp24 24 hr capsule Generic drug:  memantine Take 28 mg by mouth.   NON FORMULARY Take 4 oz by mouth 2 (two) times daily. Med Pass 2.0   oxyCODONE-acetaminophen 5-325 MG tablet Commonly known as:  PERCOCET Take 1-2 tablets by mouth every 4 (four) hours as needed for severe pain.   polyethylene glycol packet Commonly known as:  MIRALAX / GLYCOLAX Take 17 g by mouth daily.   triamcinolone cream 0.1 % Commonly known as:  KENALOG Apply 1 application topically daily as needed.   UTI-STAT Liqd Take  30 mLs by mouth daily.            Discharge Care Instructions  (From admission, onward)         Start     Ordered   06/30/18 0000  Weight bearing as tolerated     06/30/18 1902         Follow-up Information    Tarry KosXu, Naiping M, MD In 2 weeks.   Specialty:  Orthopedic Surgery Why:  For suture removal, For wound re-check Contact information: [address] [place] [phone]        [facility]. Schedule an appointment as soon as possible for a visit in 1 week(s).   Contact information: [address] [place] [phone]         Allergies  Allergen Reactions  . Ciprofloxacin Hcl Other (See Comments)    [facility]    You were cared for by a hospitalist during your hospital stay. If you have any questions about your discharge medications or the care you  received while you were in the hospital after you are discharged, you can call the unit and asked to speak with the hospitalist on call if the hospitalist that took care of you is not available. Once you are discharged, your primary care physician will handle any further medical issues. Please note that no refills for any discharge medications will be authorized once you are discharged, as it is imperative that you return to your primary care physician (or establish a relationship with a primary care physician if you do not have one) for your aftercare needs so that they can reassess your need for medications and monitor your lab values.  Consultations:  Orthopedic   Procedures/Studies: Dg Chest 1 View  Result Date: 06/30/2018 CLINICAL DATA:  Initial evaluation for acute trauma, hip fracture. EXAM: CHEST  1 VIEW COMPARISON:  Prior radiograph from 07/22/2016. FINDINGS: Cardiomegaly, stable. Mediastinal silhouette within normal limits. Aortic atherosclerosis. Lungs somewhat hyperinflated with changes related to  underlying COPD. No focal infiltrates. No edema or effusion. No appreciable pneumothorax on this supine view the chest. Diffuse osteopenia.  No acute osseous abnormality. IMPRESSION: 1. No active cardiopulmonary disease. 2. Underlying COPD. 3. Stable cardiomegaly without edema. 4. Aortic atherosclerosis. Electronically Signed   By: Rise Mu M.D.   On: 06/30/2018 00:55   Dg Tibia/fibula Left  Result Date: 06/30/2018 CLINICAL DATA:  Initial evaluation for acute trauma, fall. EXAM: LEFT TIBIA AND FIBULA - 2 VIEW COMPARISON:  None. FINDINGS: No acute fracture or dislocation. Diffuse osteopenia noted. No acute soft tissue abnormality. Vascular calcifications noted within the visualized leg. Degenerative osteoarthrosis with chondrocalcinosis noted at the knee. IMPRESSION: No acute osseous abnormality about the left tibia/fibula. Electronically Signed   By: Rise Mu M.D.   On: 06/30/2018 00:58   Ct Head Wo Contrast  Result Date: 06/30/2018 CLINICAL DATA:  Head trauma, minor, pt on anticoagulation; Head trauma, minor, GCS>=13, high clinical risk, initial exam. One [age] year old post fall this afternoon. EXAM: CT HEAD WITHOUT CONTRAST CT CERVICAL SPINE WITHOUT CONTRAST TECHNIQUE: Multidetector CT imaging of the head and cervical spine was performed following the standard protocol without intravenous contrast. Multiplanar CT image reconstructions of the cervical spine were also generated. COMPARISON:  CT 07/22/2016 FINDINGS: CT HEAD FINDINGS Brain: Unchanged atrophy and chronic small vessel ischemia. No intracranial hemorrhage, mass effect, or midline shift. No hydrocephalus. The basilar cisterns are patent. No evidence of territorial infarct or acute ischemia. No extra-axial or intracranial fluid collection. Vascular: Atherosclerosis of skullbase vasculature without hyperdense vessel or abnormal calcification. Skull: No fracture or focal lesion. Sinuses/Orbits: Postsurgical change of the  paranasal sinuses without acute inflammation. Mastoid air cells are clear. No acute orbital abnormality. Other: None. CT CERVICAL SPINE FINDINGS Patient had difficulty tolerating the exam, there is motion artifact. Alignment: No traumatic subluxation. Minimal anterolisthesis of C4 on C5 and C7 on T1, chronic and likely facet mediated. Skull base and vertebrae: No evidence of fracture, motion limits detailed assessment. The vertebral body heights are preserved. Degenerative change at C1-C2 again seen. Soft tissues and spinal canal: No prevertebral fluid or swelling. No visible canal hematoma. Disc levels: Disc space narrowing and endplate spurring, most prominent at C6-C7, unchanged from prior exam. Multilevel facet arthropathy. Upper chest: Biapical pleuroparenchymal scarring. No acute findings. Other: Carotid calcifications. IMPRESSION: 1. No acute intracranial abnormality. No skull fracture. Stable atrophy and chronic small vessel ischemia. 2. Motion limited evaluation of the cervical spine. No evidence of acute fracture. Degenerative change is grossly stable from 2018. Electronically  Signed   By: Narda Rutherford M.D.   On: 06/30/2018 00:36   Ct Cervical Spine Wo Contrast  Result Date: 06/30/2018 CLINICAL DATA:  Head trauma, minor, pt on anticoagulation; Head trauma, minor, GCS>=13, high clinical risk, initial exam. One hundred year old post fall this afternoon. EXAM: CT HEAD WITHOUT CONTRAST CT CERVICAL SPINE WITHOUT CONTRAST TECHNIQUE: Multidetector CT imaging of the head and cervical spine was performed following the standard protocol without intravenous contrast. Multiplanar CT image reconstructions of the cervical spine were also generated. COMPARISON:  CT 07/22/2016 FINDINGS: CT HEAD FINDINGS Brain: Unchanged atrophy and chronic small vessel ischemia. No intracranial hemorrhage, mass effect, or midline shift. No hydrocephalus. The basilar cisterns are patent. No evidence of territorial infarct or acute  ischemia. No extra-axial or intracranial fluid collection. Vascular: Atherosclerosis of skullbase vasculature without hyperdense vessel or abnormal calcification. Skull: No fracture or focal lesion. Sinuses/Orbits: Postsurgical change of the paranasal sinuses without acute inflammation. Mastoid air cells are clear. No acute orbital abnormality. Other: None. CT CERVICAL SPINE FINDINGS Patient had difficulty tolerating the exam, there is motion artifact. Alignment: No traumatic subluxation. Minimal anterolisthesis of C4 on C5 and C7 on T1, chronic and likely facet mediated. Skull base and vertebrae: No evidence of fracture, motion limits detailed assessment. The vertebral body heights are preserved. Degenerative change at C1-C2 again seen. Soft tissues and spinal canal: No prevertebral fluid or swelling. No visible canal hematoma. Disc levels: Disc space narrowing and endplate spurring, most prominent at C6-C7, unchanged from prior exam. Multilevel facet arthropathy. Upper chest: Biapical pleuroparenchymal scarring. No acute findings. Other: Carotid calcifications. IMPRESSION: 1. No acute intracranial abnormality. No skull fracture. Stable atrophy and chronic small vessel ischemia. 2. Motion limited evaluation of the cervical spine. No evidence of acute fracture. Degenerative change is grossly stable from 2018. Electronically Signed   By: Narda Rutherford M.D.   On: 06/30/2018 00:36   Dg Hand Complete Right  Result Date: 06/30/2018 CLINICAL DATA:  Initial evaluation for acute trauma, fall. EXAM: RIGHT HAND - COMPLETE 3+ VIEW COMPARISON:  None. FINDINGS: No acute fracture or dislocation. Moderate to advanced osteoarthritic changes present throughout the hand, most notable at the right first [facility] joint. Angulation at the right first MCP and IP joints likely chronic and degenerative. Suspected remotely healed fracture of the mid right fifth metacarpal shaft. No acute soft tissue abnormality. Diffuse osteopenia noted.  IMPRESSION: No acute osseous abnormality about the right hand. Electronically Signed   By: Rise Mu M.D.   On: 06/30/2018 00:57   Dg C-arm 1-60 Min  Result Date: 06/30/2018 CLINICAL DATA:  Intramedullary nail fixation of the right femur. EXAM: DG C-ARM 61-120 MIN; RIGHT FEMUR 2 VIEWS COMPARISON:  Pelvis 06/30/2018 FINDINGS: Intraoperative fluoroscopy is obtained for surgical control purposes. Fluoroscopy time is recorded at 1 minute 26 seconds. Since the previous study, there has been interval placement of an intramedullary rod and 2 compression screws across the comminuted inter trochanteric fracture previously demonstrated in the right hip. Fracture fragments demonstrate near-anatomic alignment post fixation. There is a distal locking screw present on the intramedullary rod. Hardware components appear intact. Fracture deformities also incidentally noted in the right inferior and superior pubic rami. Vascular calcifications. Degenerative changes in the right hip and knee. IMPRESSION: Intraoperative fluoroscopy obtained for surgical control purposes demonstrating internal fixation of intertrochanteric fracture of the right hip. Electronically Signed   By: Burman Nieves M.D.   On: 06/30/2018 19:32   Dg Hip Unilat With Pelvis 2-3 Views Right  Result Date: 06/30/2018 CLINICAL DATA:  Initial evaluation for acute trauma, fall. EXAM: DG HIP (WITH OR WITHOUT PELVIS) 2-3V RIGHT COMPARISON:  None. FINDINGS: Complex and comminuted intratrochanteric fracture of the right hip with mild displacement and superior subluxation. Femoral head remains normally position within the acetabulum. Bony pelvis intact. No acute abnormality about the left hip. Diffuse osteopenia noted. Soft tissue swelling overlies the right hip. IMPRESSION: Acute comminuted intertrochanteric fracture of the right hip. Electronically Signed   By: Rise MuBenjamin  McClintock M.D.   On: 06/30/2018 00:53   Dg Femur, Min 2 Views Right  Result  Date: 06/30/2018 CLINICAL DATA:  Intramedullary nail fixation of the right femur. EXAM: DG C-ARM 61-120 MIN; RIGHT FEMUR 2 VIEWS COMPARISON:  Pelvis 06/30/2018 FINDINGS: Intraoperative fluoroscopy is obtained for surgical control purposes. Fluoroscopy time is recorded at 1 minute 26 seconds. Since the previous study, there has been interval placement of an intramedullary rod and 2 compression screws across the comminuted inter trochanteric fracture previously demonstrated in the right hip. Fracture fragments demonstrate near-anatomic alignment post fixation. There is a distal locking screw present on the intramedullary rod. Hardware components appear intact. Fracture deformities also incidentally noted in the right inferior and superior pubic rami. Vascular calcifications. Degenerative changes in the right hip and knee. IMPRESSION: Intraoperative fluoroscopy obtained for surgical control purposes demonstrating internal fixation of intertrochanteric fracture of the right hip. Electronically Signed   By: Burman NievesWilliam  Stevens M.D.   On: 06/30/2018 19:32      Subjective: laying her bed comfortably, no complaints at the moment.   Discharge Exam: Vitals:   07/03/18 0800 07/03/18 0900  BP:  (!) 91/55  Pulse: 64 93  Resp:    Temp:  98 F (36.7 C)  SpO2: 95% 95%   Vitals:   07/02/18 2300 07/03/18 0300 07/03/18 0800 07/03/18 0900  BP: 121/60 (!) 124/57  (!) 91/55  Pulse: 81 69 64 93  Resp:      Temp: 98.9 F (37.2 C) 98.2 F (36.8 C)  98 F (36.7 C)  TempSrc: Oral Oral  Oral  SpO2: 90% 93% 95% 95%    General: Pt is alert, awake, not in acute distress; elderly frail  Cardiovascular: RRR, S1/S2 +, no rubs, no gallops Respiratory: CTA bilaterally, no wheezing, no rhonchi Abdominal: Soft, NT, ND, bowel sounds + Extremities: no edema, no cyanosis Hip dressing without any evidence of bleeding    The results of significant diagnostics from this hospitalization (including imaging, microbiology,  ancillary and laboratory) are listed below for reference.     Microbiology: No results found for this or any previous visit (from the past 240 hour(s)).   Labs: BNP (last 3 results) No results for input(s): BNP in the last 8760 hours. Basic Metabolic Panel: Recent Labs  Lab 06/29/18 2333 06/30/18 0637 07/01/18 0627 07/02/18 0616  NA 137 137 138 143  K 3.5 3.5 3.5 4.0  CL 105 102 105 110  CO2 22 24 21* 23  GLUCOSE 136* 119* 132* 110*  BUN 18 21 31* 27*  CREATININE 0.74 0.78 1.20* 0.80  CALCIUM 8.6* 8.5* 7.9* 8.3*   Liver Function Tests: Recent Labs  Lab 07/01/18 0627  AST 26  ALT 19  ALKPHOS 58  BILITOT 0.6  PROT 5.3*  ALBUMIN 3.0*   No results for input(s): LIPASE, AMYLASE in the last 168 hours. No results for input(s): AMMONIA in the last 168 hours. CBC: Recent Labs  Lab 06/29/18 2333 06/30/18 [id number] 07/01/18 0627 07/01/18 2128 07/02/18 0616 07/03/18  0543  WBC 11.9* 9.6 9.7  --  10.2 8.5  NEUTROABS 10.6* 8.1*  --   --   --   --   HGB 8.8* 8.6* 6.4* 9.9* 10.3* 9.7*  HCT 30.5* 28.9* 21.1* 30.8* 32.5* 29.9*  MCV 88.2 88.4 87.6  --  88.3 89.0  PLT 226 222 174  --  152 152   Cardiac Enzymes: No results for input(s): CKTOTAL, CKMB, CKMBINDEX, TROPONINI in the last 168 hours. BNP: Invalid input(s): POCBNP CBG: No results for input(s): GLUCAP in the last 168 hours. D-Dimer No results for input(s): DDIMER in the last 72 hours. Hgb A1c No results for input(s): HGBA1C in the last 72 hours. Lipid Profile No results for input(s): CHOL, HDL, LDLCALC, TRIG, CHOLHDL, LDLDIRECT in the last 72 hours. Thyroid function studies No results for input(s): TSH, T4TOTAL, T3FREE, THYROIDAB in the last 72 hours.  Invalid input(s): FREET3 Anemia work up No results for input(s): VITAMINB12, FOLATE, FERRITIN, TIBC, IRON, RETICCTPCT in the last 72 hours. Urinalysis    Component Value Date/Time   COLORURINE YELLOW 05/14/2015 1825   APPEARANCEUR CLEAR 05/14/2015 1825    LABSPEC 1.018 05/14/2015 1825   PHURINE 6.0 05/14/2015 1825   GLUCOSEU NEGATIVE 05/14/2015 1825   HGBUR NEGATIVE 05/14/2015 1825   BILIRUBINUR NEGATIVE 05/14/2015 1825   KETONESUR NEGATIVE 05/14/2015 1825   PROTEINUR NEGATIVE 05/14/2015 1825   NITRITE NEGATIVE 05/14/2015 1825   LEUKOCYTESUR NEGATIVE 05/14/2015 1825   Sepsis Labs Invalid input(s): PROCALCITONIN,  WBC,  LACTICIDVEN Microbiology No results found for this or any previous visit (from the past 240 hour(s)).   Time coordinating discharge:  I have spent 35 minutes face to face with the patient and on the ward discussing the patients care, assessment, plan and disposition with other care givers. >50% of the time was devoted counseling the patient about the risks and benefits of treatment/Discharge disposition and coordinating care.   SIGNED:   Dimple Nanas, MD  [facility] 07/03/2018, 11:03 AM Pager   If 7PM-7AM, please contact night-coverage [email or web address] Password [email or web address]

## 2018-07-03 NOTE — Progress Notes (Addendum)
IV removed.  Report called to [place] at [place].  Patient to be transported via [facility], ETA not provided.  Written prescriptions for Lovenox and Percocet provided and included in paperwork sent via [facility].

## 2018-07-03 NOTE — Discharge Instructions (Signed)
Hip Fracture Treated With ORIF  A hip fracture is a break in the upper part of the thigh bone (femur). This is usually the result of an injury, often a fall. Open reduction with internal fixation (ORIF) is a common surgery used to fix a hip fracture or a broken hip. A surgical cut (incision) in the skin will be made to open the fracture area. This lets your health care provider see the broken bone. The bone pieces will then be put back together. Hardware, such as nails, pins, or compression screws will be used to hold the bone pieces in place. Tell a health care provider about:  Any allergies you have.  All medicines you are taking, including vitamins, herbs, eye drops, creams, and over-the-counter medicines.  Any problems you or family members have had with anesthetic medicines.  Any blood disorders you have.  Any surgeries you have had.  Any medical conditions you have.  Whether you are pregnant or may be pregnant. What are the risks? Generally, this is a safe procedure. However, problems may occur, including:  Infection.  Bleeding.  Allergic reactions to medicines.  Blood clot. This can form in the leg and travel to the lungs.  Damage to nerves or other structures.  Lung infection (pneumonia).  Continuing pain.  Difficulty walking.  Failure of bones to heal. What happens before the procedure? Staying hydrated Follow instructions from your health care provider about hydration, which may include:  Up to 2 hours before the procedure - you may continue to drink clear liquids, such as water, clear fruit juice, black coffee, and plain tea. Eating and drinking restrictions Follow instructions from your health care provider about eating and drinking, which may include:  8 hours before the procedure - stop eating heavy meals or foods such as meat, fried foods, or fatty foods.  6 hours before the procedure - stop eating light meals or foods, such as toast or cereal.  6  hours before the procedure - stop drinking milk or drinks that contain milk.  2 hours before the procedure - stop drinking clear liquids. Medicines Ask your health care provider about:  Changing or stopping your regular medicines. This is especially important if you are taking diabetes medicines or blood thinners.  Taking medicines such as aspirin and ibuprofen. These medicines can thin your blood. Do not take these medicines unless your health care provider tells you to take them.  Taking over-the-counter medicines, vitamins, herbs, and supplements. General instructions  A thorough medical exam will be done to ensure that you are healthy enough for surgery. Tests include: ? A physical exam. ? A test of how your nerves and veins work (neurovascular assessment). ? Blood tests. ? Urine test. ? Electrocardiogram (ECG).  You may have imaging tests such as X-rays, CT scan, or MRI.  Plan to have someone take you home from the hospital or clinic.  Plan to have someone care for you for several days after you leave the hospital or clinic. This is important.  Ask your health care provider what steps will be taken to prevent infection. These may include: ? Removing hair at the surgery site. ? Washing skin with a germ-killing soap. ? Antibiotic medicine. What happens during the procedure?  An IV will be inserted into one of your veins.  You will be given one or more of the following: ? A medicine to help you relax (sedative). ? A medicine to numb the area (local anesthetic). ? A medicine that  makes you fall asleep (general anesthetic). ? A medicine that is injected into your spine to numb the area below and slightly above the injection site (spinal anesthetic). ? A medicine that is injected into an area of your body to numb everything below the injection site (regional anesthetic).  Small monitors will be attached to your body. They will be used to check your heart, blood pressure, and  oxygen level.  You will receive an antibiotic through your IV during surgery.  A catheter may be inserted into your bladder to collect urine while you are asleep.  Your health care provider will: ? Make an incision over the hip. ? Move your bone pieces back into normal alignment and position. ? Secure the bone pieces with hardware (nails, pins, or compression screws).  X-rays may be taken to make sure the bone pieces are in the correct position.  The incision will be closed with small stitches (sutures) or staples.  A bandage (dressing) or some other kind of wound cover will be placed over the incision. The procedure may vary among health care providers and hospitals. What happens after the procedure?  Your blood pressure, heart rate, breathing rate, and blood oxygen level will be monitored until you leave the hospital or clinic.  You may continue to receive fluids through the IV.  You will receive additional antibiotics through your IV for the next 24 hours.  You will be given medicine for pain as needed.  You may receive a blood thinner to prevent blood clots.  You may have to wear compression stockings. These stockings help to prevent blood clots and reduce swelling in your legs.  You may be given instructions about how much body weight you can safely support on your injured leg (weight-bearing restrictions). You may be given crutches, a cane, or a walker to help you move around so that you do not bear any weight on your injured leg.  You will be helped out of bed so you can begin moving around. This is important to improve blood flow and breathing. Summary  Open reduction with internal fixation (ORIF) is a common surgery used to fix a hip fracture or a broken hip.  You will be given anesthetic medicines to make you fall asleep, and your health care provider will fix your fracture with nails, pins, or compression screws.  After the surgery, you may receive a blood thinner  or wear compression stockings to prevent blood clots. This information is not intended to replace advice given to you by your health care provider. Make sure you discuss any questions you have with your health care provider. Document Released: 05/27/2009 Document Revised: 07/19/2017 Document Reviewed: 07/19/2017 Elsevier Interactive Patient Education  2019 [employer]   Hip Fracture Treated With ORIF  A hip fracture is a break in the upper part of the thigh bone (femur). This is usually the result of an injury, often a fall. Open reduction with internal fixation (ORIF) is a common surgery used to fix a hip fracture or a broken hip. A surgical cut (incision) in the skin will be made to open the fracture area. This lets your health care provider see the broken bone. The bone pieces will then be put back together. Hardware, such as nails, pins, or compression screws will be used to hold the bone pieces in place. Tell a health care provider about:  Any allergies you have.  All medicines you are taking, including vitamins, herbs, eye drops, creams, and over-the-counter medicines.  Any problems you or family members have had with anesthetic medicines.  Any blood disorders you have.  Any surgeries you have had.  Any medical conditions you have.  Whether you are pregnant or may be pregnant. What are the risks? Generally, this is a safe procedure. However, problems may occur, including:  Infection.  Bleeding.  Allergic reactions to medicines.  Blood clot. This can form in the leg and travel to the lungs.  Damage to nerves or other structures.  Lung infection (pneumonia).  Continuing pain.  Difficulty walking.  Failure of bones to heal. What happens before the procedure? Staying hydrated Follow instructions from your health care provider about hydration, which may include:  Up to 2 hours before the procedure - you may continue to drink clear liquids, such as water, clear  fruit juice, black coffee, and plain tea. Eating and drinking restrictions Follow instructions from your health care provider about eating and drinking, which may include:  8 hours before the procedure - stop eating heavy meals or foods such as meat, fried foods, or fatty foods.  6 hours before the procedure - stop eating light meals or foods, such as toast or cereal.  6 hours before the procedure - stop drinking milk or drinks that contain milk.  2 hours before the procedure - stop drinking clear liquids. Medicines Ask your health care provider about:  Changing or stopping your regular medicines. This is especially important if you are taking diabetes medicines or blood thinners.  Taking medicines such as aspirin and ibuprofen. These medicines can thin your blood. Do not take these medicines unless your health care provider tells you to take them.  Taking over-the-counter medicines, vitamins, herbs, and supplements. General instructions  A thorough medical exam will be done to ensure that you are healthy enough for surgery. Tests include: ? A physical exam. ? A test of how your nerves and veins work (neurovascular assessment). ? Blood tests. ? Urine test. ? Electrocardiogram (ECG).  You may have imaging tests such as X-rays, CT scan, or MRI.  Plan to have someone take you home from the hospital or clinic.  Plan to have someone care for you for several days after you leave the hospital or clinic. This is important.  Ask your health care provider what steps will be taken to prevent infection. These may include: ? Removing hair at the surgery site. ? Washing skin with a germ-killing soap. ? Antibiotic medicine. What happens during the procedure?  An IV will be inserted into one of your veins.  You will be given one or more of the following: ? A medicine to help you relax (sedative). ? A medicine to numb the area (local anesthetic). ? A medicine that makes you fall asleep  (general anesthetic). ? A medicine that is injected into your spine to numb the area below and slightly above the injection site (spinal anesthetic). ? A medicine that is injected into an area of your body to numb everything below the injection site (regional anesthetic).  Small monitors will be attached to your body. They will be used to check your heart, blood pressure, and oxygen level.  You will receive an antibiotic through your IV during surgery.  A catheter may be inserted into your bladder to collect urine while you are asleep.  Your health care provider will: ? Make an incision over the hip. ? Move your bone pieces back into normal alignment and position. ? Secure the bone pieces with hardware (nails, pins,  or compression screws).  X-rays may be taken to make sure the bone pieces are in the correct position.  The incision will be closed with small stitches (sutures) or staples.  A bandage (dressing) or some other kind of wound cover will be placed over the incision. The procedure may vary among health care providers and hospitals. What happens after the procedure?  Your blood pressure, heart rate, breathing rate, and blood oxygen level will be monitored until you leave the hospital or clinic.  You may continue to receive fluids through the IV.  You will receive additional antibiotics through your IV for the next 24 hours.  You will be given medicine for pain as needed.  You may receive a blood thinner to prevent blood clots.  You may have to wear compression stockings. These stockings help to prevent blood clots and reduce swelling in your legs.  You may be given instructions about how much body weight you can safely support on your injured leg (weight-bearing restrictions). You may be given crutches, a cane, or a walker to help you move around so that you do not bear any weight on your injured leg.  You will be helped out of bed so you can begin moving around. This is  important to improve blood flow and breathing. Summary  Open reduction with internal fixation (ORIF) is a common surgery used to fix a hip fracture or a broken hip.  You will be given anesthetic medicines to make you fall asleep, and your health care provider will fix your fracture with nails, pins, or compression screws.  After the surgery, you may receive a blood thinner or wear compression stockings to prevent blood clots. This information is not intended to replace advice given to you by your health care provider. Make sure you discuss any questions you have with your health care provider. Document Released: 05/27/2009 Document Revised: 07/19/2017 Document Reviewed: 07/19/2017 Elsevier Interactive Patient Education  2019 [employer]   Hip Fracture  A hip fracture is a break in the upper part of the thigh bone (femur). This is usually the result of an injury, commonly a fall. What are the causes? This condition may be caused by:  A direct hit or injury (trauma) to the side of the hip, such as from a fall or a car accident. What increases the risk? You are more likely to develop this condition if:  You have poor balance or an unsteady walking pattern (gait). Certain conditions contribute to poor balance, including Parkinson disease and dementia.  You have thinning or weakening of your bones, such as from osteopenia or osteoporosis.  You have cancer that spreads to the leg bones.  You have certain conditions that can weaken your bones, such as thyroid disorders, intestine disorders, or a lack (deficiency) of certain nutrients.  You smoke.  You take certain medicines, such as steroids.  You have a history of broken bones. What are the signs or symptoms? Symptoms of this condition include:  Pain over the injured hip. This is commonly felt on the side of the hip or in the front groin area.  Stiffness, bruising, and swelling over the hip.  Pain with movement of the leg,  especially lifting it up. Pain often gets better with rest.  Difficulty or inability to stand, walk, or use the leg to support body weight (put weight on the leg).  The leg rolling outward when lying down.  The affected leg being shorter than the other leg. How is  this diagnosed? This condition may be diagnosed based on:  Your symptoms.  A physical exam.  X-rays. These may be done: ? To confirm the diagnosis. ? To determine the type and location of the fracture. ? To check for other injuries.  MRI or CT scans. These may be done if the fracture is not visible on an X-ray. How is this treated? Treatment for this condition depends on the severity and location of your fracture. In most cases, surgery is necessary. Surgery may involve:  Repairing the fracture with a screw, nail, or rod to hold the bone in place (open reduction and internal fixation, ORIF).  Replacing the damaged parts of the femur with metal implants (hemiarthroplasty or arthroplasty). If your fracture is less severe, or if you are not eligible for surgery, you may have non-surgical treatment. Non-surgical treatment may involve:  Using crutches, a walker, or a wheelchair until your health care provider says that you can support (bear) weight on your hip.  Medicines to help reduce pain and swelling.  Having regular X-rays to monitor your fracture and make sure that it is healing.  Physical therapy. You may need physical therapy after surgery, too. Follow these instructions at home: Activity  Do not use your injured leg to support your body weight until your health care provider says that you can. ? Follow standing and walking restrictions as told by your health care provider. ? Use crutches, a walker, or a wheelchair as directed.  Avoid any activities that cause pain or irritation in your hip. Ask your health care provider what activities are safe for you.  Do not drive or use heavy machinery until your health  care provider approves.  If physical therapy was prescribed, do exercises as told by your health care provider. General instructions  Take over-the-counter and prescription medicines only as told by your health care provider.  If directed, put ice on the injured area: ? Put ice in a plastic bag. ? Place a towel between your skin and the bag. ? Leave the ice on for 20 minutes, 2-3 times a day.  Do not use any products that contain nicotine or tobacco, such as cigarettes and e-cigarettes. These can delay bone healing. If you need help quitting, ask your health care provider.  Keep all follow-up visits as told by your health care provider. This is important. How is this prevented?  To prevent falls at home: ? Use a cane, walker, or wheelchair as directed. ? Make sure your rooms and hallways are free of clutter, obstacles, and cords. ? Install grab bars in your bedroom and bathrooms. ? Always use handrails when going up and down stairs. ? Use nightlights around the house.  Exercise regularly. Ask what forms of exercise are safe for you, such as walking and strength and balance exercises.  Visit an eye doctor regularly to have your eyesight checked. This can help prevent falls.  Make sure you get enough calcium and vitamin D.  Do not use any products that contain nicotine or tobacco, such as cigarettes and e-cigarettes. If you need help quitting, ask your health care provider.  Limit alcohol use.  If you have an underlying condition that caused your hip fracture, work with your health care provider to manage your condition. Contact a health care provider if:  Your pain gets worse or it does not get better with rest or medicine.  You develop any of the following in your leg or foot: ? Numbness. ? Tingling. ?  A change in skin color (discoloration). ? Skin feeling cold to the touch. Get help right away if:  Your pain suddenly gets worse.  You cannot move your  hip. Summary  A hip fracture is a break in the upper part of the thigh bone (femur).  Treatment typically require surgical management to restore stability and function to the hip.  Pain medicine and icing of the affected leg can help manage pain and swelling. Follow directions as told by your health care provider. This information is not intended to replace advice given to you by your health care provider. Make sure you discuss any questions you have with your health care provider. Document Released: 06/08/2005 Document Revised: 02/26/2018 Document Reviewed: 07/11/2016 Elsevier Interactive Patient Education  2019 [employer]    1. Change dressings as needed 2. May shower but keep incisions covered and dry 3. Take lovenox to prevent blood clots 4. Take stool softeners as needed 5. Take pain meds as needed

## 2018-07-04 ENCOUNTER — Encounter: Payer: Self-pay | Admitting: Internal Medicine

## 2018-07-05 ENCOUNTER — Encounter (HOSPITAL_COMMUNITY): Payer: Self-pay | Admitting: Orthopaedic Surgery

## 2018-07-12 ENCOUNTER — Ambulatory Visit (INDEPENDENT_AMBULATORY_CARE_PROVIDER_SITE_OTHER): Payer: Medicare Other | Admitting: Orthopaedic Surgery

## 2018-07-12 ENCOUNTER — Ambulatory Visit (INDEPENDENT_AMBULATORY_CARE_PROVIDER_SITE_OTHER): Payer: Medicare Other

## 2018-07-12 DIAGNOSIS — S72141D Displaced intertrochanteric fracture of right femur, subsequent encounter for closed fracture with routine healing: Secondary | ICD-10-CM

## 2018-07-12 NOTE — Progress Notes (Signed)
   Post-Op Visit Note   Patient: Rachel Barry           Date of Birth: [birth year]           MRN: [id number] Visit Date: 07/12/2018 PCP: System, Pcp Not In   Assessment & Plan:  Chief Complaint:  Chief Complaint  Patient presents with  . Right Hip - Routine Post Op   Visit Diagnoses:  1. Closed comminuted intertrochanteric fracture of proximal end of right femur, with routine healing, subsequent encounter     Plan: Rachel Barry is two-week status post intramedullary fixation of an intertrochanteric fracture.  She presents today for her first follow-up appointment.  Overall she is doing well.  She is slowly progressing with physical therapy.  Her surgical incisions are fully healed.  She does have a moderate sized seroma around her right thigh.  There is no evidence of infection.  The staples were removed today and replaced with Steri-Strips.  I recommend heat to the seroma.  She can discontinue Lovenox and begin aspirin 81 mg daily.  Continue with physical therapy.  Recheck in 4 weeks with 2 view x-rays of the right hip.  Follow-Up Instructions: Return in about 4 weeks (around 08/09/2018).   Orders:  Orders Placed This Encounter  Procedures  . XR FEMUR, MIN 2 VIEWS RIGHT   No orders of the defined types were placed in this encounter.   Imaging: Xr Femur, Min 2 Views Right  Result Date: 07/12/2018 Stable fixation of right intertrochanteric fracture.   PMFS History: Patient Active Problem List   Diagnosis Date Noted  . Protein-calorie malnutrition, severe 07/02/2018  . Hip fracture, right, closed, initial encounter (HCC) 06/30/2018  . Seizure (HCC) 06/30/2018  . Essential hypertension 06/30/2018  . Normochromic normocytic anemia 06/30/2018  . Hip fracture (HCC) 06/30/2018   Past Medical History:  Diagnosis Date  . Anxiety   . Arthritis   . Dementia (HCC)   . Glaucoma   . Hypertension   . Seizures (HCC)   . Thyroid disease     Family History  Problem Relation Age  of Onset  . Rectal cancer Father     Past Surgical History:  Procedure Laterality Date  . HERNIA REPAIR    . INTRAMEDULLARY (IM) NAIL INTERTROCHANTERIC Right 06/30/2018   Procedure: INTRAMEDULLARY (IM) NAIL RIGHT INTERTROCHANTRIC;  Surgeon: Tarry Kos, MD;  Location: [facility] OR;  Service: Orthopedics;  Laterality: Right;  . NASAL SINUS SURGERY     Social History   Occupational History  . Not on file  Tobacco Use  . Smoking status: Never Smoker  . Smokeless tobacco: Never Used  Substance and Sexual Activity  . Alcohol use: No  . Drug use: No  . Sexual activity: Never

## 2018-07-15 ENCOUNTER — Inpatient Hospital Stay (INDEPENDENT_AMBULATORY_CARE_PROVIDER_SITE_OTHER): Payer: Medicare Other | Admitting: Orthopaedic Surgery

## 2018-07-17 ENCOUNTER — Emergency Department (HOSPITAL_COMMUNITY)
Admission: EM | Admit: 2018-07-17 | Discharge: 2018-07-18 | Disposition: A | Discharge status: Home / Self Care | Payer: Medicare Other | Attending: Emergency Medicine | Admitting: Emergency Medicine

## 2018-07-17 ENCOUNTER — Encounter (HOSPITAL_COMMUNITY): Payer: Self-pay | Admitting: Emergency Medicine

## 2018-07-17 ENCOUNTER — Emergency Department (HOSPITAL_COMMUNITY): Payer: Medicare Other

## 2018-07-17 ENCOUNTER — Other Ambulatory Visit: Payer: Self-pay

## 2018-07-17 DIAGNOSIS — Z79899 Other long term (current) drug therapy: Secondary | ICD-10-CM | POA: Insufficient documentation

## 2018-07-17 DIAGNOSIS — F0390 Unspecified dementia without behavioral disturbance: Secondary | ICD-10-CM | POA: Diagnosis not present

## 2018-07-17 DIAGNOSIS — S2232XA Fracture of one rib, left side, initial encounter for closed fracture: Secondary | ICD-10-CM | POA: Diagnosis not present

## 2018-07-17 DIAGNOSIS — I10 Essential (primary) hypertension: Secondary | ICD-10-CM | POA: Insufficient documentation

## 2018-07-17 DIAGNOSIS — W06XXXA Fall from bed, initial encounter: Secondary | ICD-10-CM | POA: Insufficient documentation

## 2018-07-17 DIAGNOSIS — Y999 Unspecified external cause status: Secondary | ICD-10-CM | POA: Diagnosis not present

## 2018-07-17 DIAGNOSIS — Y939 Activity, unspecified: Secondary | ICD-10-CM | POA: Insufficient documentation

## 2018-07-17 DIAGNOSIS — Y92122 Bedroom in nursing home as the place of occurrence of the external cause: Secondary | ICD-10-CM | POA: Insufficient documentation

## 2018-07-17 DIAGNOSIS — S299XXA Unspecified injury of thorax, initial encounter: Secondary | ICD-10-CM | POA: Diagnosis present

## 2018-07-17 DIAGNOSIS — R296 Repeated falls: Secondary | ICD-10-CM

## 2018-07-17 LAB — COMPREHENSIVE METABOLIC PANEL
ALT: 19 U/L (ref 0–44)
AST: 34 U/L (ref 15–41)
Albumin: 3.6 g/dL (ref 3.5–5.0)
Alkaline Phosphatase: 156 U/L — ABNORMAL HIGH (ref 38–126)
Anion gap: 13 (ref 5–15)
BILIRUBIN TOTAL: 1.2 mg/dL (ref 0.3–1.2)
BUN: 12 mg/dL (ref 8–23)
CO2: 26 mmol/L (ref 22–32)
Calcium: 9.2 mg/dL (ref 8.9–10.3)
Chloride: 100 mmol/L (ref 98–111)
Creatinine, Ser: 0.55 mg/dL (ref 0.44–1.00)
GFR calc non Af Amer: >60 mL/min (ref >60)
Glucose, Bld: 145 mg/dL — ABNORMAL HIGH (ref 70–99)
Potassium: 3.8 mmol/L (ref 3.5–5.1)
Sodium: 139 mmol/L (ref 135–145)
Total Protein: 6.6 g/dL (ref 6.5–8.1)

## 2018-07-17 LAB — CBC WITH DIFFERENTIAL/PLATELET
Abs Immature Granulocytes: 0.07 10*3/uL (ref 0.00–0.07)
Basophils Absolute: 0 10*3/uL (ref 0.0–0.1)
Basophils Relative: 0 %
Eosinophils Absolute: 0.2 10*3/uL (ref 0.0–0.5)
Eosinophils Relative: 2 %
HEMATOCRIT: 41.9 % (ref 36.0–46.0)
Hemoglobin: 12.5 g/dL (ref 12.0–15.0)
Immature Granulocytes: 1 %
Lymphocytes Relative: 11 %
Lymphs Abs: 1 10*3/uL (ref 0.7–4.0)
MCH: 28.5 pg (ref 26.0–34.0)
MCHC: 29.8 g/dL — ABNORMAL LOW (ref 30.0–36.0)
MCV: 95.7 fL (ref 80.0–100.0)
MONO ABS: 0.5 10*3/uL (ref 0.1–1.0)
Monocytes Relative: 5 %
Neutro Abs: 7.4 10*3/uL (ref 1.7–7.7)
Neutrophils Relative %: 81 %
Platelets: 359 10*3/uL (ref 150–400)
RBC: 4.38 MIL/uL (ref 3.87–5.11)
RDW: 19.8 % — ABNORMAL HIGH (ref 11.5–15.5)
WBC: 9.1 10*3/uL (ref 4.0–10.5)
nRBC: 0 % (ref 0.0–0.2)

## 2018-07-17 MED ORDER — FENTANYL CITRATE (PF) 100 MCG/2ML IJ SOLN
25.0000 ug | Freq: Once | INTRAMUSCULAR | Status: AC
  Administered 2018-07-17: 25 ug via INTRAVENOUS
  Filled 2018-07-17: qty 2

## 2018-07-17 NOTE — ED Provider Notes (Signed)
I saw and evaluated the patient, reviewed the resident's note and I agree with the findings and plan with the following exceptions.   -year-old DNR patient here with a fall that was unwitnessed sometime a couple hours prior to arrival.  No other information available.  On my exam patient has multiple bruises in various stages of healing on multiple extremities.  Seems to be having pain in her right hip and leg.  I removed c-collar after having a discussion with the patient's daughter about risks and benefits of keeping the c-collar.  Plan for CTs, labs secondary to being little bit more weak and less responsive than normal.  Disposition is appropriate.   EKG Interpretation  Date/Time:    Ventricular Rate:    PR Interval:    QRS Duration:   QT Interval:    QTC Calculation:   R Axis:     Text Interpretation:           Marily Memos, MD 07/18/18 1555

## 2018-07-17 NOTE — ED Notes (Addendum)
Daughter of pt removed C-Collar and also removing BP cuff

## 2018-07-17 NOTE — ED Notes (Addendum)
Daughter at bedside.

## 2018-07-17 NOTE — ED Triage Notes (Addendum)
Pt BIB EMS from [facility] nursing facility for an unwitnessed fall from bed.  Pt found on floor with unknown downtime.  Pt has HX of previous fall with right hip FX and repair (06/29/18).  Pt on 2L nasal cannula PRN.

## 2018-07-17 NOTE — Discharge Instructions (Signed)
Please use your incentive spirometer 5 times or more per hour as tolerated while awake.  You may take 1000 mg of acetaminophen every 8 hours for pain.  You may take 200 to 400 mg of ibuprofen every 6-8 hours as needed in addition if this is insufficient to control pain and ensure deep breaths.

## 2018-07-17 NOTE — ED Provider Notes (Signed)
[facility] EMERGENCY DEPARTMENT Provider Note   CSN: [id number] Arrival date & time: 07/17/18  1648     History   Chief Complaint No chief complaint on file.   HPI Camella Varin is a [age] y.o. female.  HPI   Mada Wichman is a [age] y.o. female with PMH of anxiety, arthritis, dementia, glaucoma, hypertension, seizures, thyroid disease, hip fracture s/p repair who presents after unwitnessed fall from bed at the nursing home.  Her daughter was present at the bedside and offered most portions of the history.  She reportedly was found lying beside her bed and told the folks at the nursing facility "I fell off the bus".  She was placed in cervical collar and transported here.  Underwent ORIF of her right hip about 2 weeks ago and had been recovering fairly well.  Normally in her baseline she is able to converse but is confused.  At this time the patient is groaning in pain and recognizes her daughter's voice, responds to her daughter's commands, but prefers to lie on her left side and not move.  Past Medical History:  Diagnosis Date  . Anxiety   . Arthritis   . Dementia (HCC)   . Glaucoma   . Hypertension   . Seizures (HCC)   . Thyroid disease     Patient Active Problem List   Diagnosis Date Noted  . Protein-calorie malnutrition, severe 07/02/2018  . Hip fracture, right, closed, initial encounter (HCC) 06/30/2018  . Seizure (HCC) 06/30/2018  . Essential hypertension 06/30/2018  . Normochromic normocytic anemia 06/30/2018  . Hip fracture (HCC) 06/30/2018    Past Surgical History:  Procedure Laterality Date  . HERNIA REPAIR    . INTRAMEDULLARY (IM) NAIL INTERTROCHANTERIC Right 06/30/2018   Procedure: INTRAMEDULLARY (IM) NAIL RIGHT INTERTROCHANTRIC;  Surgeon: Tarry KosXu, Naiping M, MD;  Location: [facility] OR;  Service: Orthopedics;  Laterality: Right;  . NASAL SINUS SURGERY       OB History    Gravida  3   Para      Term      Preterm      AB      Living  3     SAB      TAB      Ectopic      Multiple      Live Births               Home Medications    Prior to Admission medications   Medication Sig Start Date End Date Taking? Authorizing Provider  acetaminophen (TYLENOL) 325 MG tablet Take 325 mg by mouth 3 (three) times daily.    Yes [provider]  acetaminophen (TYLENOL) 500 MG tablet Take 500 mg by mouth daily as needed for moderate pain.   Yes [provider]  amLODipine (NORVASC) 5 MG tablet Take 5 mg by mouth daily.    Yes [provider]  aspirin EC 81 MG tablet Take 81 mg by mouth daily.   Yes [provider]  busPIRone (BUSPAR) 10 MG tablet Take 10 mg by mouth 2 (two) times daily with a meal.    Yes [provider]  calcitRIOL (ROCALTROL) 0.25 MCG capsule Take 0.25 mcg by mouth daily. 03/10/15  Yes [provider]  Calcium Citrate-Vitamin D 315-250 MG-UNIT TABS Take 1 tablet by mouth daily.   Yes [provider]  Cranberry-Vitamin C-Inulin (UTI-STAT) LIQD Take 30 mLs by mouth daily.   Yes [provider]  docusate sodium (  COLACE) 100 MG capsule Take 100 mg by mouth daily.   Yes [provider]  dorzolamidel-timolol (COSOPT PF) 22.3-6.8 MG/ML SOLN ophthalmic solution Place 1 drop into both eyes 2 (two) times daily.   Yes [provider]  ferrous sulfate 325 (65 FE) MG tablet Take 1 tablet (325 mg total) by mouth 2 (two) times daily with a meal for 30 days. 07/03/18 08/02/18 Yes Amin, Ankit Chirag, MD  FLUoxetine (PROZAC) 20 MG capsule Take 20 mg by mouth daily.    Yes [provider]  guaifenesin (ROBITUSSIN) 100 MG/5ML syrup Take 200 mg by mouth 4 (four) times daily as needed for cough.   Yes [provider]  levETIRAcetam (KEPPRA) 500 MG tablet Take 500 mg by mouth 2 (two) times daily.   Yes [provider]  levothyroxine (SYNTHROID, LEVOTHROID) 50 MCG tablet Take 50 mcg by mouth every evening.  04/03/15  Yes  [provider]  LORazepam (ATIVAN) 0.5 MG tablet Take 0.5 mg by mouth every 6 (six) hours as needed for anxiety.   Yes [provider]  memantine (NAMENDA XR) 28 MG CP24 24 hr capsule Take 28 mg by mouth daily.    Yes [provider]  Multiple Vitamin (MULTIVITAMIN WITH MINERALS) TABS tablet Take 1 tablet by mouth daily.   Yes [provider]  NUTRITIONAL SUPPLEMENTS PO Take 120 mLs by mouth 2 (two) times daily. MedPass 2.0   Yes [provider]  oxyCODONE-acetaminophen (PERCOCET) 5-325 MG tablet Take 1-2 tablets by mouth every 4 (four) hours as needed for severe pain. Patient taking differently: Take 1-2 tablets by mouth every 4 (four) hours as needed (1 tablet - mild to moderate pain, 2 tablets - severe pain).  06/30/18  Yes Tarry Kos, MD  polyethylene glycol (MIRALAX / GLYCOLAX) packet Take 17 g by mouth daily.   Yes [provider]  triamcinolone cream (KENALOG) 0.1 % Apply 1 application topically daily as needed (rash/irritation).    Yes [provider]  enoxaparin (LOVENOX) 40 MG/0.4ML injection Inject 0.4 mLs (40 mg total) into the skin daily. Patient not taking: Reported on 07/17/2018 06/30/18   Tarry Kos, MD  methocarbamol (ROBAXIN) 500 MG tablet Take 1 tablet (500 mg total) by mouth every 6 (six) hours as needed for up to 20 doses for muscle spasms. Patient not taking: Reported on 07/17/2018 07/03/18   Dimple Nanas, MD    Family History Family History  Problem Relation Age of Onset  . Rectal cancer Father     Social History Social History   Tobacco Use  . Smoking status: Never Smoker  . Smokeless tobacco: Never Used  Substance Use Topics  . Alcohol use: No  . Drug use: No     Allergies   Ciprofloxacin hcl   Review of Systems Review of Systems  Unable to perform ROS: Dementia     Physical Exam Updated Vital Signs BP (!) 186/113   Pulse 77   Temp 97.7 F (36.5 C) (Axillary)   Resp 20    SpO2 96%   Physical Exam Vitals signs and nursing note reviewed.  Constitutional:      General: She is in acute distress.     Appearance: She is well-developed. She is cachectic. She is ill-appearing.     Interventions: Cervical collar in place.  HENT:     Head: Normocephalic and atraumatic.  Eyes:     General: Lids are normal.     Conjunctiva/sclera: Conjunctivae normal.  Neck:     Musculoskeletal: Neck supple. No spinous process tenderness.     Comments: Does not verbalize but does not appear to be uncomfortable with palpation of the cervical spine in the midline or paraspinal muscles. Cardiovascular:     Rate and Rhythm: Normal rate and regular rhythm.     Heart sounds: S1 normal and S2 normal. No murmur.  Pulmonary:     Effort: Pulmonary effort is normal. No respiratory distress.     Breath sounds: Normal breath sounds.  Abdominal:     General: Abdomen is flat.     Palpations: Abdomen is soft.     Tenderness: There is no guarding or rebound.     Comments: Does not appear to be uncomfortable with palpation of the abdomen.  Abdomen is soft and nondistended.  Musculoskeletal:     Right hip: She exhibits tenderness, bony tenderness and deformity.     Right knee: Normal.     Right upper leg: She exhibits tenderness and bony tenderness.  Skin:    General: Skin is warm and dry.  Neurological:     Mental Status: She is alert.  Psychiatric:        Behavior: Behavior is cooperative.      ED Treatments / Results  Labs (all labs ordered are listed, but only abnormal results are displayed) Labs Reviewed  CBC WITH DIFFERENTIAL/PLATELET - Abnormal; Notable for the following components:      Result Value   MCHC 29.8 (*)    RDW 19.8 (*)    All other components within normal limits  COMPREHENSIVE METABOLIC PANEL - Abnormal; Notable for the following components:   Glucose, Bld 145 (*)    Alkaline Phosphatase 156 (*)    All other components within normal limits  URINALYSIS,  ROUTINE W REFLEX MICROSCOPIC    EKG None  Radiology Ct Head Wo Contrast  Result Date: 07/17/2018 CLINICAL DATA:  Unwitnessed fall from bed. Found on the floor. EXAM: CT HEAD WITHOUT CONTRAST CT CERVICAL SPINE WITHOUT CONTRAST TECHNIQUE: Multidetector CT imaging of the head and cervical spine was performed following the standard protocol without intravenous contrast. Multiplanar CT image reconstructions of the cervical spine were also generated. COMPARISON:  06/30/2018 FINDINGS: CT HEAD FINDINGS Brain: Generalized atrophy. Extensive chronic small-vessel ischemic changes affecting the white matter, basal ganglia, thalami and pons. No evidence of acute infarction, mass lesion, hemorrhage, hydrocephalus or extra-axial collection. Vascular: There is atherosclerotic calcification of the major vessels at the base of the brain. Skull: Negative Sinuses/Orbits: Clear except for chronic inflammatory changes of the left maxillary sinus. Previous functional endoscopic sinus surgery. Orbits negative. Other: None CT CERVICAL SPINE FINDINGS Alignment: No traumatic malalignment. Skull base and vertebrae: No sign of fracture. The scan does not go all the way through C7 and includes only a small portion of T1. Soft tissues and spinal canal: Negative Disc levels: Chronic degenerative spondylosis and facet arthropathy without apparent significant spinal stenosis. Upper chest: Not included. Other: None IMPRESSION: 1. Head CT: No acute or traumatic finding. Atrophy and extensive chronic small-vessel ischemic changes. 2. Cervical spine CT: No acute or traumatic finding. Chronic degenerative spondylosis and facet arthropathy. Electronically Signed   By: Paulina Fusi M.D.   On: 07/17/2018 21:14   Ct Cervical Spine Wo Contrast  Result Date: 07/17/2018 CLINICAL DATA:  Unwitnessed fall from bed. Found on the floor. EXAM: CT HEAD WITHOUT CONTRAST CT CERVICAL SPINE WITHOUT CONTRAST TECHNIQUE: Multidetector CT imaging of the head and  cervical spine was performed  following the standard protocol without intravenous contrast. Multiplanar CT image reconstructions of the cervical spine were also generated. COMPARISON:  06/30/2018 FINDINGS: CT HEAD FINDINGS Brain: Generalized atrophy. Extensive chronic small-vessel ischemic changes affecting the white matter, basal ganglia, thalami and pons. No evidence of acute infarction, mass lesion, hemorrhage, hydrocephalus or extra-axial collection. Vascular: There is atherosclerotic calcification of the major vessels at the base of the brain. Skull: Negative Sinuses/Orbits: Clear except for chronic inflammatory changes of the left maxillary sinus. Previous functional endoscopic sinus surgery. Orbits negative. Other: None CT CERVICAL SPINE FINDINGS Alignment: No traumatic malalignment. Skull base and vertebrae: No sign of fracture. The scan does not go all the way through C7 and includes only a small portion of T1. Soft tissues and spinal canal: Negative Disc levels: Chronic degenerative spondylosis and facet arthropathy without apparent significant spinal stenosis. Upper chest: Not included. Other: None IMPRESSION: 1. Head CT: No acute or traumatic finding. Atrophy and extensive chronic small-vessel ischemic changes. 2. Cervical spine CT: No acute or traumatic finding. Chronic degenerative spondylosis and facet arthropathy. Electronically Signed   By: Paulina Fusi M.D.   On: 07/17/2018 21:14   Dg Chest Portable 1 View  Result Date: 07/17/2018 CLINICAL DATA:  Unwitnessed fall.  Right hip pain. EXAM: PORTABLE CHEST 1 VIEW COMPARISON:  Chest radiograph 06/30/2018 FINDINGS: Stably enlarged cardiac silhouette. Calcific atherosclerotic disease and tortuosity of the aorta. There is no evidence of focal airspace consolidation, pleural effusion or pneumothorax. Minimally displaced ninth left lateral rib fracture. Soft tissues are grossly normal. IMPRESSION: Minimally displaced ninth left lateral rib fracture. No  evidence of pneumothorax. Stably enlarged cardiac silhouette. Calcific atherosclerotic disease and tortuosity of the aorta. No evidence of pulmonary edema or consolidation. Electronically Signed   By: Ted Mcalpine M.D.   On: 07/17/2018 18:01   Dg Hip Port Unilat W Or Wo Pelvis 1 View Right  Result Date: 07/17/2018 CLINICAL DATA:  Right hip pain following a fall. EXAM: DG HIP (WITH OR WITHOUT PELVIS) 1V PORT RIGHT COMPARISON:  06/30/2018. FINDINGS: Interval compression screw and rod fixation of the previously demonstrated comminuted right intertrochanteric fracture. Significantly improved position and alignment of the major fragments with mild medial and posterior displacement of the distal fragment. An old, healed right inferior pubic ramus fracture is unchanged. No new fractures or dislocations are visualized. Diffuse osteopenia is noted as well as mild lower lumbar scoliosis. IMPRESSION: Interval hardware fixation of the recently demonstrated right intertrochanteric fracture with no new fracture or dislocation. Electronically Signed   By: Beckie Salts M.D.   On: 07/17/2018 18:03    Procedures Procedures (including critical care time)  Medications Ordered in ED Medications  fentaNYL (SUBLIMAZE) injection 25 mcg (25 mcg Intravenous Given 07/17/18 1756)  fentaNYL (SUBLIMAZE) injection 25 mcg (25 mcg Intravenous Given 07/17/18 1838)     Initial Impression / Assessment and Plan / ED Course  I have reviewed the triage vital signs and the nursing notes.  Pertinent labs & imaging results that were available during my care of the patient were reviewed by me and considered in my medical decision making (see chart for details).     MDM:  Imaging: Chest x-ray shows minimally displaced ninth left lateral rib fracture without evidence for pneumothorax.  Stable enlarged cardiac silhouette.  No evidence of pulmonary edema or consolidation.  Right hip shows interval hardware fixation of the recently  demonstrated right intertrochanteric fracture with no new fracture or dislocation.  CT head and C-spine without acute pathology.  ED  Provider Interpretation of EKG: EKG shows sinus rhythm with a rate of 80 bpm, normal axis, no ST segment elevation or depression, pathology T wave changes or bundle-branch blocks.  Prolonged QTC of 483.  Labs: CBC unremarkable, CMP unremarkable,  On initial evaluation, patient appears ill and uncomfortable. Afebrile and hemodynamically stable.  Patient is altered and appears slightly worse than her baseline but near baseline per daughter at the bedside.  Presents after unwitnessed fall at facility as detailed above.  On exam, patient has tenderness and mild overlying chronic appearing ecchymosis over the right lateral hip.  Neurovascularly intact to the best of the patient's ability to comply with exam.  Protecting her airway and sitting up without difficulty. Ttolerating secretions without difficulty.  Cervical collar in place from EMS.  Patient was given 25 mcg of IV fentanyl x2 and CT head and C-spine were ordered.  Patient was not able to tolerate cervical collar and was repeatedly pulling at it and attempting to bite it.  She appeared more uncomfortable and agitated with the collar on.  Daughter asked if we could take the collar off.  Dr. Clayborne DanaMesner spoke to the patient's daughter and explained risks and benefits of maintaining cervical collar.  Patient's daughter expressed understanding of the risk of cervical cord damage that could be potentially life-threatening.  Preferred collar to be removed.  Overall feel this is appropriate given the patient's age and condition.  After removal the patient was much more calm.   CT head and C-spine unremarkable for acute pathology.  Chest x-ray with left-sided ninth rib fracture minimally displaced.  Right hip and pelvis film negative.  Labs unremarkable as above.  Counseled the patient's daughter on the risks of rib fracture  especially at the patient's age with her comorbidities.  However, there is no surgical intervention indicated at this time.  Lungs clear bilaterally with sats in the mid to high 90s and no increased work of breathing or evidence for discomfort on multiple re-evaluations.  Incentive spirometer ordered and patient's daughter was counseled on the importance of using this to decrease risk of pneumonia and other sequela of rib fracture and immobilization.  Counseled on appropriate pain control and its importance.  Discharged in stable condition back to her facility as there is no indication for additional intervention or imaging at this time.  She is a DNR.  Given return precautions.  The plan for this patient was discussed with Dr. Clayborne DanaMesner who voiced agreement and who oversaw evaluation and treatment of this patient.    Final Clinical Impressions(s) / ED Diagnoses   Final diagnoses:  Unwitnessed fall  Closed fracture of one rib of left side, initial encounter  Dementia without behavioral disturbance, unspecified dementia type [facility](HCC)    ED Discharge Orders    None       Trinaty Bundrick, Sherryle LisJames F II, MD 07/18/18 [place]0023    Marily MemosMesner, Jason, MD 07/18/18 1555

## 2018-07-17 NOTE — ED Notes (Signed)
Pt's family has blood pressure cuff and pulse ox  and want leave it on.

## 2018-07-20 ENCOUNTER — Telehealth (INDEPENDENT_AMBULATORY_CARE_PROVIDER_SITE_OTHER): Payer: Self-pay | Admitting: Orthopaedic Surgery

## 2018-07-20 NOTE — Telephone Encounter (Signed)
Can you please advise on heat and/or ice? See note about fall as well.

## 2018-07-20 NOTE — Telephone Encounter (Signed)
Patient's daughter Bonita Quin called asked should the rehab center be applying heat or cold to the incision. The Rehab Center is using heat instead of cold. Bonita Quin said her mother is at [facility].  Bonita Quin said her mother fell again 3 days ago but, she is fine. She went to the ER. The number to contact Bonita Quin is [phone]

## 2018-07-20 NOTE — Telephone Encounter (Signed)
I would recommend ice, not heat.  Can put it around incision but not on top of incision

## 2018-07-20 NOTE — Telephone Encounter (Signed)
Can you call daughter and advise Lindsey's recommendation?

## 2018-07-21 NOTE — Telephone Encounter (Signed)
LMOM for [employer]

## 2018-08-09 ENCOUNTER — Ambulatory Visit (INDEPENDENT_AMBULATORY_CARE_PROVIDER_SITE_OTHER): Payer: Medicare Other | Admitting: Orthopaedic Surgery

## 2018-08-09 ENCOUNTER — Ambulatory Visit (INDEPENDENT_AMBULATORY_CARE_PROVIDER_SITE_OTHER): Payer: Medicare Other

## 2018-08-09 ENCOUNTER — Encounter (INDEPENDENT_AMBULATORY_CARE_PROVIDER_SITE_OTHER): Payer: Self-pay | Admitting: Orthopaedic Surgery

## 2018-08-09 ENCOUNTER — Ambulatory Visit (INDEPENDENT_AMBULATORY_CARE_PROVIDER_SITE_OTHER): Payer: Self-pay

## 2018-08-09 DIAGNOSIS — S72141D Displaced intertrochanteric fracture of right femur, subsequent encounter for closed fracture with routine healing: Secondary | ICD-10-CM

## 2018-08-09 NOTE — Progress Notes (Signed)
Post-Op Visit Note   Patient: Rachel Barry           Date of Birth: 1916/03/06           MRN: [id number] Visit Date: 08/09/2018 PCP: System, Pcp Not In   Assessment & Plan:  Chief Complaint:  Chief Complaint  Patient presents with  . Right Leg - Routine Post Op   Visit Diagnoses:  1. Closed comminuted intertrochanteric fracture of proximal end of right femur, with routine healing, subsequent encounter     Plan: Patient is a pleasant 85 year old female with a history of dementia comes in today 6 weeks status post right hip intramedullary nail, date of surgery 06/30/2018.  She is here with her daughter.  She has been residing at [facility].  Her daughter notes that she was occasionally ambulating with formal physical therapy prior to a fall which occurred on 07/17/2018.  At that point, she was seen in the ED where x-rays of her hip were obtained.  No acute findings at that point.  She comes in today for follow-up.  She has apparently been in increased pain since the fall on the 26th according to her daughter.  She has not been ambulating with physical therapy.  She is in a wheelchair today.  Examination of her right hip reveals well-healed surgical incision without evidence of infection.  She does have a fairly tense seroma at the most proximal with ecchymosis.  She is neurovascularly intact distally.  Despite having collapse of her femoral head seen on x-ray today, we will allow her to continue to weight-bear as tolerated and advance with physical therapy.  Follow-up with [name] in 6 weeks time for repeat evaluation and x-rays of the pelvis and femur.  Follow-Up Instructions: Return in about 6 weeks (around 09/20/2018).   Orders:  Orders Placed This Encounter  Procedures  . XR HIP UNILAT W OR W/O PELVIS 2-3 VIEWS RIGHT  . XR FEMUR, MIN 2 VIEWS RIGHT   No orders of the defined types were placed in this encounter.   Imaging: Xr Femur, Min 2 Views Right  Result Date: 08/09/2018 No acute  or structural abnormalities  Xr Hip Unilat W Or W/o Pelvis 2-3 Views Right  Result Date: 08/09/2018 X-rays demonstrate collapse of the femoral head.   PMFS History: Patient Active Problem List   Diagnosis Date Noted  . Closed comminuted intertrochanteric fracture of proximal end of right femur, with routine healing, subsequent encounter 08/09/2018  . Protein-calorie malnutrition, severe 07/02/2018  . Hip fracture, right, closed, initial encounter (HCC) 06/30/2018  . Seizure (HCC) 06/30/2018  . Essential hypertension 06/30/2018  . Normochromic normocytic anemia 06/30/2018  . Hip fracture (HCC) 06/30/2018   Past Medical History:  Diagnosis Date  . Anxiety   . Arthritis   . Dementia (HCC)   . Glaucoma   . Hypertension   . Seizures (HCC)   . Thyroid disease     Family History  Problem Relation Age of Onset  . Rectal cancer Father     Past Surgical History:  Procedure Laterality Date  . HERNIA REPAIR    . INTRAMEDULLARY (IM) NAIL INTERTROCHANTERIC Right 06/30/2018   Procedure: INTRAMEDULLARY (IM) NAIL RIGHT INTERTROCHANTRIC;  Surgeon: Tarry Kos, MD;  Location: [facility] OR;  Service: Orthopedics;  Laterality: Right;  . NASAL SINUS SURGERY     Social History   Occupational History  . Not on file  Tobacco Use  . Smoking status: Never Smoker  . Smokeless tobacco: Never Used  Substance and Sexual Activity  . Alcohol use: No  . Drug use: No  . Sexual activity: Never

## 2018-09-19 ENCOUNTER — Observation Stay (HOSPITAL_COMMUNITY)
Admission: EM | Admit: 2018-09-19 | Discharge: 2018-09-20 | Disposition: A | Discharge status: Home / Self Care | Payer: Medicare Other | Attending: Internal Medicine | Admitting: Internal Medicine

## 2018-09-19 ENCOUNTER — Other Ambulatory Visit: Payer: Self-pay

## 2018-09-19 ENCOUNTER — Encounter (HOSPITAL_COMMUNITY): Payer: Self-pay | Admitting: Emergency Medicine

## 2018-09-19 DIAGNOSIS — R569 Unspecified convulsions: Secondary | ICD-10-CM

## 2018-09-19 DIAGNOSIS — W19XXXA Unspecified fall, initial encounter: Secondary | ICD-10-CM

## 2018-09-19 DIAGNOSIS — E039 Hypothyroidism, unspecified: Secondary | ICD-10-CM | POA: Diagnosis present

## 2018-09-19 DIAGNOSIS — R4189 Other symptoms and signs involving cognitive functions and awareness: Secondary | ICD-10-CM | POA: Diagnosis present

## 2018-09-19 DIAGNOSIS — N390 Urinary tract infection, site not specified: Principal | ICD-10-CM

## 2018-09-19 DIAGNOSIS — I10 Essential (primary) hypertension: Secondary | ICD-10-CM | POA: Diagnosis not present

## 2018-09-19 DIAGNOSIS — R4182 Altered mental status, unspecified: Secondary | ICD-10-CM | POA: Diagnosis present

## 2018-09-19 DIAGNOSIS — F0390 Unspecified dementia without behavioral disturbance: Secondary | ICD-10-CM | POA: Diagnosis not present

## 2018-09-19 DIAGNOSIS — R404 Transient alteration of awareness: Secondary | ICD-10-CM | POA: Diagnosis present

## 2018-09-19 NOTE — ED Triage Notes (Signed)
Patient from an assisted living dementia facility, fell and hit head on left forehead.  She is now unresponsive, deviated gaze to right.  She is usually able to answer a few questions appropriately.  Upon arrival she is moaning with painful stimuli.  Patient does not follow commands, does not open eyes.

## 2018-09-20 ENCOUNTER — Emergency Department (HOSPITAL_COMMUNITY): Payer: Medicare Other

## 2018-09-20 ENCOUNTER — Ambulatory Visit (INDEPENDENT_AMBULATORY_CARE_PROVIDER_SITE_OTHER): Payer: Medicare Other | Admitting: Orthopaedic Surgery

## 2018-09-20 ENCOUNTER — Encounter (HOSPITAL_COMMUNITY): Payer: Self-pay | Admitting: Internal Medicine

## 2018-09-20 DIAGNOSIS — N390 Urinary tract infection, site not specified: Secondary | ICD-10-CM | POA: Diagnosis not present

## 2018-09-20 DIAGNOSIS — E039 Hypothyroidism, unspecified: Secondary | ICD-10-CM | POA: Diagnosis present

## 2018-09-20 DIAGNOSIS — I10 Essential (primary) hypertension: Secondary | ICD-10-CM

## 2018-09-20 DIAGNOSIS — R4189 Other symptoms and signs involving cognitive functions and awareness: Secondary | ICD-10-CM | POA: Diagnosis not present

## 2018-09-20 DIAGNOSIS — R569 Unspecified convulsions: Secondary | ICD-10-CM

## 2018-09-20 LAB — CBC WITH DIFFERENTIAL/PLATELET
Abs Immature Granulocytes: 0.04 10*3/uL (ref 0.00–0.07)
Basophils Absolute: 0 10*3/uL (ref 0.0–0.1)
Basophils Relative: 1 %
Eosinophils Absolute: 0.4 10*3/uL (ref 0.0–0.5)
Eosinophils Relative: 5 %
HCT: 43.7 % (ref 36.0–46.0)
Hemoglobin: 13.7 g/dL (ref 12.0–15.0)
Immature Granulocytes: 1 %
Lymphocytes Relative: 12 %
Lymphs Abs: 1 10*3/uL (ref 0.7–4.0)
MCH: 31.9 pg (ref 26.0–34.0)
MCHC: 31.4 g/dL (ref 30.0–36.0)
MCV: 101.9 fL — ABNORMAL HIGH (ref 80.0–100.0)
Monocytes Absolute: 0.5 10*3/uL (ref 0.1–1.0)
Monocytes Relative: 5 %
NEUTROS PCT: 76 %
Neutro Abs: 6.7 10*3/uL (ref 1.7–7.7)
PLATELETS: 188 10*3/uL (ref 150–400)
RBC: 4.29 MIL/uL (ref 3.87–5.11)
RDW: 14.2 % (ref 11.5–15.5)
WBC: 8.7 10*3/uL (ref 4.0–10.5)
nRBC: 0 % (ref 0.0–0.2)

## 2018-09-20 LAB — BASIC METABOLIC PANEL
Anion gap: 9 (ref 5–15)
BUN: 15 mg/dL (ref 8–23)
CO2: 27 mmol/L (ref 22–32)
Calcium: 8.8 mg/dL — ABNORMAL LOW (ref 8.9–10.3)
Chloride: 105 mmol/L (ref 98–111)
Creatinine, Ser: 0.55 mg/dL (ref 0.44–1.00)
GFR calc Af Amer: >60 mL/min (ref >60)
GFR calc non Af Amer: >60 mL/min (ref >60)
Glucose, Bld: 88 mg/dL (ref 70–99)
Potassium: 3.5 mmol/L (ref 3.5–5.1)
Sodium: 141 mmol/L (ref 135–145)

## 2018-09-20 LAB — URINALYSIS, ROUTINE W REFLEX MICROSCOPIC
Bilirubin Urine: NEGATIVE
Glucose, UA: NEGATIVE mg/dL
Ketones, ur: NEGATIVE mg/dL
Leukocytes,Ua: NEGATIVE
Nitrite: NEGATIVE
PH: 7 (ref 5.0–8.0)
Protein, ur: 100 mg/dL — AB
Specific Gravity, Urine: 1.017 (ref 1.005–1.030)
WBC, UA: >50 WBC/hpf — ABNORMAL HIGH (ref 0–5)

## 2018-09-20 LAB — CBC
HCT: 41.4 % (ref 36.0–46.0)
Hemoglobin: 13 g/dL (ref 12.0–15.0)
MCH: 30.7 pg (ref 26.0–34.0)
MCHC: 31.4 g/dL (ref 30.0–36.0)
MCV: 97.9 fL (ref 80.0–100.0)
Platelets: 198 10*3/uL (ref 150–400)
RBC: 4.23 MIL/uL (ref 3.87–5.11)
RDW: 14 % (ref 11.5–15.5)
WBC: 6.9 10*3/uL (ref 4.0–10.5)
nRBC: 0 % (ref 0.0–0.2)

## 2018-09-20 LAB — COMPREHENSIVE METABOLIC PANEL
ALT: 52 U/L — ABNORMAL HIGH (ref 0–44)
AST: 66 U/L — ABNORMAL HIGH (ref 15–41)
Albumin: 3.8 g/dL (ref 3.5–5.0)
Alkaline Phosphatase: 98 U/L (ref 38–126)
Anion gap: 11 (ref 5–15)
BUN: 19 mg/dL (ref 8–23)
CHLORIDE: 107 mmol/L (ref 98–111)
CO2: 23 mmol/L (ref 22–32)
Calcium: 9.1 mg/dL (ref 8.9–10.3)
Creatinine, Ser: 0.65 mg/dL (ref 0.44–1.00)
GFR calc Af Amer: >60 mL/min (ref >60)
GFR calc non Af Amer: >60 mL/min (ref >60)
Glucose, Bld: 95 mg/dL (ref 70–99)
Potassium: 3.9 mmol/L (ref 3.5–5.1)
Sodium: 141 mmol/L (ref 135–145)
Total Bilirubin: 0.4 mg/dL (ref 0.3–1.2)
Total Protein: 6.4 g/dL — ABNORMAL LOW (ref 6.5–8.1)

## 2018-09-20 LAB — TROPONIN I: Troponin I: <0.03 ng/mL (ref <0.03)

## 2018-09-20 MED ORDER — AMLODIPINE BESYLATE 5 MG PO TABS
5.0000 mg | ORAL_TABLET | Freq: Every day | ORAL | Status: DC
  Administered 2018-09-20: 5 mg via ORAL
  Filled 2018-09-20: qty 1

## 2018-09-20 MED ORDER — ACETAMINOPHEN 650 MG RE SUPP: 650.0000 mg | Freq: Four times a day (QID) | RECTAL | Status: DC | PRN

## 2018-09-20 MED ORDER — DOCUSATE SODIUM 100 MG PO CAPS
100.0000 mg | ORAL_CAPSULE | Freq: Every day | ORAL | Status: DC
  Administered 2018-09-20: 100 mg via ORAL
  Filled 2018-09-20: qty 1

## 2018-09-20 MED ORDER — ENSURE ENLIVE PO LIQD
237.0000 mL | Freq: Three times a day (TID) | ORAL | Status: DC
  Administered 2018-09-20 (×2): 237 mL via ORAL

## 2018-09-20 MED ORDER — SENNOSIDES-DOCUSATE SODIUM 8.6-50 MG PO TABS
1.0000 | ORAL_TABLET | Freq: Every day | ORAL | Status: DC
  Administered 2018-09-20: 1 via ORAL
  Filled 2018-09-20: qty 1

## 2018-09-20 MED ORDER — LEVETIRACETAM 750 MG PO TABS
750.0000 mg | ORAL_TABLET | Freq: Two times a day (BID) | ORAL | Status: DC
  Administered 2018-09-20: 750 mg via ORAL
  Filled 2018-09-20: qty 1

## 2018-09-20 MED ORDER — SODIUM CHLORIDE 0.9 % IV SOLN
1.0000 g | Freq: Once | INTRAVENOUS | Status: AC
  Administered 2018-09-20: 1 g via INTRAVENOUS
  Filled 2018-09-20: qty 10

## 2018-09-20 MED ORDER — FLUOXETINE HCL 20 MG PO CAPS
20.0000 mg | ORAL_CAPSULE | Freq: Every day | ORAL | Status: DC
  Administered 2018-09-20: 20 mg via ORAL
  Filled 2018-09-20: qty 1

## 2018-09-20 MED ORDER — DORZOLAMIDE HCL-TIMOLOL MAL 2-0.5 % OP SOLN
1.0000 [drp] | Freq: Two times a day (BID) | OPHTHALMIC | Status: DC
  Administered 2018-09-20: 1 [drp] via OPHTHALMIC
  Filled 2018-09-20: qty 10

## 2018-09-20 MED ORDER — LEVETIRACETAM IN NACL 1000 MG/100ML IV SOLN
1000.0000 mg | Freq: Once | INTRAVENOUS | Status: AC
  Administered 2018-09-20: 1000 mg via INTRAVENOUS
  Filled 2018-09-20: qty 100

## 2018-09-20 MED ORDER — BUSPIRONE HCL 10 MG PO TABS
10.0000 mg | ORAL_TABLET | Freq: Two times a day (BID) | ORAL | Status: DC
  Administered 2018-09-20: 10 mg via ORAL
  Filled 2018-09-20 (×2): qty 1

## 2018-09-20 MED ORDER — POLYETHYLENE GLYCOL 3350 17 G PO PACK
17.0000 g | PACK | Freq: Every day | ORAL | Status: DC
  Administered 2018-09-20: 17 g via ORAL
  Filled 2018-09-20: qty 1

## 2018-09-20 MED ORDER — ACETAMINOPHEN 325 MG PO TABS: 650.0000 mg | ORAL_TABLET | Freq: Four times a day (QID) | ORAL | Status: DC | PRN

## 2018-09-20 MED ORDER — ADULT MULTIVITAMIN W/MINERALS CH
1.0000 | ORAL_TABLET | Freq: Every day | ORAL | Status: DC
  Administered 2018-09-20: 1 via ORAL
  Filled 2018-09-20: qty 1

## 2018-09-20 MED ORDER — LEVOTHYROXINE SODIUM 50 MCG PO TABS: 50.0000 ug | ORAL_TABLET | Freq: Every evening | ORAL | Status: DC

## 2018-09-20 MED ORDER — SODIUM CHLORIDE 0.9 % IV SOLN
1.0000 g | INTRAVENOUS | Status: DC
  Filled 2018-09-20: qty 10

## 2018-09-20 MED ORDER — MEMANTINE HCL ER 28 MG PO CP24
28.0000 mg | ORAL_CAPSULE | Freq: Every day | ORAL | Status: DC
  Administered 2018-09-20: 28 mg via ORAL
  Filled 2018-09-20: qty 1

## 2018-09-20 MED ORDER — CEFPODOXIME PROXETIL 100 MG PO TABS: 100.0000 mg | ORAL_TABLET | Freq: Two times a day (BID) | ORAL | 0 refills | Status: AC

## 2018-09-20 MED ORDER — PRO-STAT SUGAR FREE PO LIQD
30.0000 mL | Freq: Every day | ORAL | Status: DC
  Administered 2018-09-20: 30 mL via ORAL
  Filled 2018-09-20: qty 30

## 2018-09-20 MED ORDER — LIDOCAINE 5 % EX PTCH
1.0000 | MEDICATED_PATCH | Freq: Every day | CUTANEOUS | Status: DC
  Administered 2018-09-20: 1 via TRANSDERMAL
  Filled 2018-09-20: qty 1

## 2018-09-20 MED ORDER — LORAZEPAM 0.5 MG PO TABS: 0.5000 mg | ORAL_TABLET | Freq: Four times a day (QID) | ORAL | Status: DC | PRN

## 2018-09-20 NOTE — Progress Notes (Signed)
Pt was brought up to the floor, alert but oriented to self only.

## 2018-09-20 NOTE — Evaluation (Signed)
Physical Therapy Evaluation Patient Details Name: Rachel Barry MRN: [id number] DOB: 1915-07-10 Today's Date: 09/20/2018   History of Present Illness  Rachel Barry is a 85 y.o. female with history of dementia, hypertension, hypothyroidism and seizure was brought to the ER after patient had an unwitnessed fall.  As per the report from the ER physician patient was doing fine after the fall but later became more altered in mental status and lethargic and was brought to the ER.  Clinical Impression  Pt admitted with above. Pt climbing out of bed upon PT arrival, pt with stool. Pt with known dementia but unsure of patients PLOF. Pt currently requiring maxA for all mobility as pt with decreased insight to deficits, safety and is at increased falls risk. Acute PT to cont to follow.    Follow Up Recommendations SNF(return to memory care unit if they can provide inc. assist)    Equipment Recommendations  None recommended by PT(defer to next venue)    Recommendations for Other Services       Precautions / Restrictions Precautions Precautions: Fall Precaution Comments: dememtia  Restrictions Weight Bearing Restrictions: No      Mobility  Bed Mobility Overal bed mobility: Needs Assistance Bed Mobility: Rolling;Supine to Sit;Sit to Supine Rolling: Max assist;+2 for physical assistance   Supine to sit: Max assist Sit to supine: Max assist   General bed mobility comments: pt attempting to climb out of bed upon arrival  Transfers Overall transfer level: Needs assistance Equipment used: 2 person hand held assist Transfers: Sit to/from [employer] Transfers Sit to Stand: Max assist Stand pivot transfers: Max assist       General transfer comment: pt very frail and thin, pt initiating transfer but requires significant verbal and tactile cues to complete transfer to [facility]  Ambulation/Gait Ambulation/Gait assistance: (not assessed )           General Gait Details: pt took 3  steps to [facility] and then back to bed, modA to advance L LE and weight shift to sequencing stepping  Stairs            Wheelchair Mobility    Modified Rankin (Stroke Patients Only)       Balance Overall balance assessment: Needs assistance Sitting-balance support: Feet supported;No upper extremity supported Sitting balance-Leahy Scale: Fair Sitting balance - Comments: pt reaching for object, decreased insight to deficits and safety Postural control: (forward lean) Standing balance support: Bilateral upper extremity supported Standing balance-Leahy Scale: Poor Standing balance comment: pt with significant trunk flexion, dependent on physical assist                             Pertinent Vitals/Pain Pain Assessment: Faces Faces Pain Scale: Hurts even more Pain Location: bottom during hygiene, stating it's so sore Pain Descriptors / Indicators: Sore Pain Intervention(s): Monitored during session    Home Living Family/patient expects to be discharged to:: Skilled nursing facility                 Additional Comments: from memory care unit     Prior Function Level of Independence: Needs assistance   Gait / Transfers Assistance Needed: unsure, pt poor historian  ADL's / Homemaking Assistance Needed: unsure, pt poor historian  Comments: unsure of patients baseline PLOF     Hand Dominance        Extremity/Trunk Assessment   Upper Extremity Assessment Upper Extremity Assessment: Difficult to assess due to impaired cognition(pt initiating  movement with UEs to reach for objects)    Lower Extremity Assessment Lower Extremity Assessment: Difficult to assess due to impaired cognition(generalized weakness but able to WB and stand/transfer)    Cervical / Trunk Assessment Cervical / Trunk Assessment: Kyphotic  Communication   Communication: Other (comment)(difficulty communicating with PT/verbalizes app at times )  Cognition Arousal/Alertness:  Awake/alert Behavior During Therapy: Restless Overall Cognitive Status: No family/caregiver present to determine baseline cognitive functioning                                 General Comments: per chart pt with dementia      General Comments General comments (skin integrity, edema, etc.): pt with very fragile skin, brusining around L eye and forhead    Exercises     Assessment/Plan    PT Assessment Patient needs continued PT services  PT Problem List Decreased strength;Decreased activity tolerance;Decreased balance;Decreased mobility;Decreased coordination;Decreased cognition;Decreased knowledge of use of DME;Decreased safety awareness       PT Treatment Interventions Stair training;DME instruction;Gait training;Functional mobility training;Therapeutic activities;Therapeutic exercise;Balance training;Neuromuscular re-education    PT Goals (Current goals can be found in the Care Plan section)  Acute Rehab PT Goals Patient Stated Goal: unable to state PT Goal Formulation: Patient unable to participate in goal setting Time For Goal Achievement: 10/04/18 Potential to Achieve Goals: Fair    Frequency Min 3X/week   Barriers to discharge        Co-evaluation               AM-PAC PT "6 Clicks" Mobility  Outcome Measure Help needed turning from your back to your side while in a flat bed without using bedrails?: A Lot Help needed moving from lying on your back to sitting on the side of a flat bed without using bedrails?: A Lot Help needed moving to and from a bed to a chair (including a wheelchair)?: A Lot Help needed standing up from a chair using your arms (e.g., wheelchair or bedside chair)?: A Lot Help needed to walk in hospital room?: A Lot Help needed climbing 3-5 steps with a railing? : Total 6 Click Score: 11    End of Session Equipment Utilized During Treatment: Gait belt Activity Tolerance: Patient tolerated treatment well Patient left: in  bed;with call bell/phone within reach;with bed alarm set;with nursing/sitter in room Nurse Communication: Mobility status PT Visit Diagnosis: Unsteadiness on feet (R26.81);Repeated falls (R29.6);Difficulty in walking, not elsewhere classified (R26.2)    Time: 6761-9509 PT Time Calculation (min) (ACUTE ONLY): 33 min   Charges:   PT Evaluation $PT Eval Moderate Complexity: 1 Mod PT Treatments $Therapeutic Activity: 8-22 mins        Lewis Shock, PT, DPT Acute Rehabilitation Services Pager #: [phone] Office #: [phone]   Iona Hansen 09/20/2018, 12:41 PM

## 2018-09-20 NOTE — Plan of Care (Signed)
Adequate for discharge.

## 2018-09-20 NOTE — H&P (Addendum)
History and Physical    Rachel Barry JDB:[id number] DOB: 07-Feb-1916 DOA: 09/19/2018  PCP: System, Pcp Not In  Patient coming from: Home.  Chief Complaint: Unresponsive episode.  HPI: Rachel Barry is a 85 y.o. female with history of dementia, hypertension, hypothyroidism and seizure was brought to the ER after patient had an unwitnessed fall.  As per the report from the ER physician patient was doing fine after the fall but later became more altered in mental status and lethargic and was brought to the ER.  ED Course: In the ER patient was initially minimally responsive but became more alert awake.  Back to her baseline.  CT scan of the head maxillofacial C-spine was done.  Showed frontal scalp hematoma otherwise nothing acute per x-ray pelvis was negative.  Chest x-ray did not show anything acute patient was not febrile.  EKG shows normal sinus rhythm.  Given the history of seizures patient probably could have had a seizure and Keppra 1 g was loaded and admitted for further observation.  Since there was concern for UTI ceftriaxone was given.  Review of Systems: As per HPI, rest all negative.   Past Medical History:  Diagnosis Date   Anxiety    Arthritis    Dementia (HCC)    Glaucoma    Hypertension    Seizures (HCC)    Thyroid disease     Past Surgical History:  Procedure Laterality Date   HERNIA REPAIR     INTRAMEDULLARY (IM) NAIL INTERTROCHANTERIC Right 06/30/2018   Procedure: INTRAMEDULLARY (IM) NAIL RIGHT INTERTROCHANTRIC;  Surgeon: Tarry Kos, MD;  Location: [facility] OR;  Service: Orthopedics;  Laterality: Right;   NASAL SINUS SURGERY       reports that she has never smoked. She has never used smokeless tobacco. She reports that she does not drink alcohol or use drugs.  Allergies  Allergen Reactions   Ciprofloxacin Hcl Other (See Comments)    Possibly cause shoulder pain per daughter    Family History  Problem Relation Age of Onset   Rectal cancer Father      Prior to Admission medications   Medication Sig Start Date End Date Taking? Authorizing Provider  acetaminophen (TYLENOL) 325 MG tablet Take 325 mg by mouth 3 (three) times daily.    Yes [provider]  acetaminophen (TYLENOL) 500 MG tablet Take 500 mg by mouth daily as needed for moderate pain.   Yes [provider]  amLODipine (NORVASC) 5 MG tablet Take 5 mg by mouth daily.    Yes [provider]  busPIRone (BUSPAR) 10 MG tablet Take 10 mg by mouth 2 (two) times daily with a meal.    Yes [provider]  Calcium Citrate-Vitamin D 315-250 MG-UNIT TABS Take 1 tablet by mouth daily.   Yes [provider]  Cranberry-Vitamin C-Inulin (UTI-STAT) LIQD Take 30 mLs by mouth daily.   Yes [provider]  docusate sodium (COLACE) 100 MG capsule Take 100 mg by mouth daily.   Yes [provider]  dorzolamidel-timolol (COSOPT PF) 22.3-6.8 MG/ML SOLN ophthalmic solution Place 1 drop into both eyes 2 (two) times daily.   Yes [provider]  feeding supplement, ENSURE ENLIVE, (ENSURE ENLIVE) LIQD Take 237 mLs by mouth 3 (three) times daily with meals.   Yes [provider]  FLUoxetine (PROZAC) 20 MG capsule Take 20 mg by mouth daily.    Yes [provider]  guaifenesin (ROBITUSSIN) 100 MG/5ML syrup Take 200 mg by mouth 4 (four)  times daily as needed for cough.   Yes [provider]  HYDROcodone-acetaminophen (NORCO/VICODIN) 5-325 MG tablet Take 1 tablet by mouth every 8 (eight) hours.   Yes [provider]  levETIRAcetam (KEPPRA) 500 MG tablet Take 500 mg by mouth 2 (two) times daily.   Yes [provider]  levothyroxine (SYNTHROID, LEVOTHROID) 50 MCG tablet Take 50 mcg by mouth every evening.  04/03/15  Yes [provider]  Lidocaine 4 % PTCH Apply 1 patch topically daily. Apply to left lower lateral rib cage   Yes [provider]  LORazepam (ATIVAN) 0.5 MG tablet Take 0.5  mg by mouth every 6 (six) hours as needed for anxiety.   Yes [provider]  memantine (NAMENDA XR) 28 MG CP24 24 hr capsule Take 28 mg by mouth daily.    Yes [provider]  Multiple Vitamin (MULTIVITAMIN WITH MINERALS) TABS tablet Take 1 tablet by mouth daily.   Yes [provider]  NUTRITIONAL SUPPLEMENTS PO Take 120 mLs by mouth 2 (two) times daily. MedPass 2.0   Yes [provider]  oxyCODONE (OXY IR/ROXICODONE) 5 MG immediate release tablet Take 5 mg by mouth every 12 (twelve) hours as needed for severe pain.   Yes [provider]  polyethylene glycol (MIRALAX / GLYCOLAX) packet Take 17 g by mouth daily.   Yes [provider]  senna-docusate (SENOKOT-S) 8.6-50 MG tablet Take 1 tablet by mouth daily.   Yes [provider]  triamcinolone cream (KENALOG) 0.1 % Apply 1 application topically daily as needed (rash/irritation).    Yes [provider]  enoxaparin (LOVENOX) 40 MG/0.4ML injection Inject 0.4 mLs (40 mg total) into the skin daily. Patient not taking: Reported on 07/17/2018 06/30/18   Tarry Kos, MD  ferrous sulfate 325 (65 FE) MG tablet Take 1 tablet (325 mg total) by mouth 2 (two) times daily with a meal for 30 days. Patient not taking: Reported on 09/20/2018 07/03/18 08/02/18  Dimple Nanas, MD  methocarbamol (ROBAXIN) 500 MG tablet Take 1 tablet (500 mg total) by mouth every 6 (six) hours as needed for up to 20 doses for muscle spasms. Patient not taking: Reported on 07/17/2018 07/03/18   Dimple Nanas, MD  oxyCODONE-acetaminophen (PERCOCET) 5-325 MG tablet Take 1-2 tablets by mouth every 4 (four) hours as needed for severe pain. Patient not taking: Reported on 09/20/2018 06/30/18   Tarry Kos, MD    Physical Exam: Vitals:   09/20/18 0230 09/20/18 0300 09/20/18 0315 09/20/18 0356  BP: 136/79 (!) 153/82 (!) 145/81 (!) 171/85  Pulse: 65 65 67 65  Resp: 15 16 16 15   Temp:    97.6 F (36.4 C)  TempSrc:     Oral  SpO2: 93% 95% 97% 94%  Weight:          Constitutional: Moderately built and nourished. Vitals:   09/20/18 0230 09/20/18 0300 09/20/18 0315 09/20/18 0356  BP: 136/79 (!) 153/82 (!) 145/81 (!) 171/85  Pulse: 65 65 67 65  Resp: 15 16 16 15   Temp:    97.6 F (36.4 C)  TempSrc:    Oral  SpO2: 93% 95% 97% 94%  Weight:       Eyes: Anicteric no pallor.  Left periorbital hematoma. ENMT: No discharge from the ears eyes nose and mouth. Neck: No mass or.  No neck rigidity. Respiratory: No rhonchi or crepitations.   Cardiovascular: S1-S2 heard. Abdomen: Soft nontender bowel sounds present. Musculoskeletal: No edema.  No  joint effusion. Skin: No rash.  Left periorbital hematoma. Neurologic: Alert awake oriented to her name.  Moves all extremities.   Psychiatric: Oriented to her name.   Labs on Admission: I have personally reviewed following labs and imaging studies  CBC: Recent Labs  Lab 09/20/18 0010  WBC 8.7  NEUTROABS 6.7  HGB 13.7  HCT 43.7  MCV 101.9*  PLT 188   Basic Metabolic Panel: Recent Labs  Lab 09/20/18 0010  NA 141  K 3.9  CL 107  CO2 23  GLUCOSE 95  BUN 19  CREATININE 0.65  CALCIUM 9.1   GFR: CrCl cannot be calculated (Unknown ideal weight.). Liver Function Tests: Recent Labs  Lab 09/20/18 0010  AST 66*  ALT 52*  ALKPHOS 98  BILITOT 0.4  PROT 6.4*  ALBUMIN 3.8   No results for input(s): LIPASE, AMYLASE in the last 168 hours. No results for input(s): AMMONIA in the last 168 hours. Coagulation Profile: No results for input(s): INR, PROTIME in the last 168 hours. Cardiac Enzymes: Recent Labs  Lab 09/20/18 0010  TROPONINI <0.03   BNP (last 3 results) No results for input(s): PROBNP in the last 8760 hours. HbA1C: No results for input(s): HGBA1C in the last 72 hours. CBG: No results for input(s): GLUCAP in the last 168 hours. Lipid Profile: No results for input(s): CHOL, HDL, LDLCALC, TRIG, CHOLHDL, LDLDIRECT in the last 72  hours. Thyroid Function Tests: No results for input(s): TSH, T4TOTAL, FREET4, T3FREE, THYROIDAB in the last 72 hours. Anemia Panel: No results for input(s): VITAMINB12, FOLATE, FERRITIN, TIBC, IRON, RETICCTPCT in the last 72 hours. Urine analysis:    Component Value Date/Time   COLORURINE YELLOW 09/20/2018 0010   APPEARANCEUR CLOUDY (A) 09/20/2018 0010   LABSPEC 1.017 09/20/2018 0010   PHURINE 7.0 09/20/2018 0010   GLUCOSEU NEGATIVE 09/20/2018 0010   HGBUR SMALL (A) 09/20/2018 0010   BILIRUBINUR NEGATIVE 09/20/2018 0010   KETONESUR NEGATIVE 09/20/2018 0010   PROTEINUR 100 (A) 09/20/2018 0010   NITRITE NEGATIVE 09/20/2018 0010   LEUKOCYTESUR NEGATIVE 09/20/2018 0010   Sepsis Labs: (procalcitonin:4,lacticidven:4) )No results found for this or any previous visit (from the past 240 hour(s)).   Radiological Exams on Admission: Ct Head Wo Contrast  Result Date: 09/20/2018 CLINICAL DATA:  Larey Seat, hit LEFT forehead. Unresponsive. History of hypertension and dementia. EXAM: CT HEAD WITHOUT CONTRAST CT MAXILLOFACIAL WITHOUT CONTRAST CT CERVICAL SPINE WITHOUT CONTRAST TECHNIQUE: Multidetector CT imaging of the head, cervical spine, and maxillofacial structures were performed using the standard protocol without intravenous contrast. Multiplanar CT image reconstructions of the cervical spine and maxillofacial structures were also generated. COMPARISON:  CT HEAD and cervical spine July 17, 2018 FINDINGS: CT HEAD FINDINGS BRAIN: No intraparenchymal hemorrhage, mass effect nor midline shift. No acute large vascular territory infarcts. Confluent supratentorial white matter hypodensities unchanged. Old basal ganglia lacunar infarcts. No abnormal extra-axial fluid collections. Basal cisterns are patent. VASCULAR: Moderate to severe calcific atherosclerosis carotid siphon and included vertebral arteries. SKULL/SOFT TISSUES: No skull fracture. Moderate LEFT frontal scalp hematoma without  subcutaneous gas or radiopaque foreign bodies. OTHER: None. CT MAXILLOFACIAL FINDINGS OSSEOUS: No acute facial fracture. The mandible is intact, the condyles are located. No destructive bony lesions. Severe temporomandibular osteoarthrosis. Multiple absent teeth. ORBITS: Ocular globes and orbital contents are nonacute. Status post bilateral ocular lens implants. 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 Status post FESS. Mild fronto ethmoidal mucosal thickening. Mastoid air cells are well aerated. SOFT TISSUES: No significant soft tissue swelling. No  subcutaneous gas or radiopaque foreign bodies. CT CERVICAL SPINE FINDINGS ALIGNMENT: Maintenance of cervical lordosis. Minimal grade 1 C 5 6 anterolisthesis, unchanged. SKULL BASE AND VERTEBRAE: Cervical vertebral bodies intact. Stable severe C6-7 degenerative disc, moderate C3-4 and C5-6. Osteopenia without destructive bony lesions. C1-2 articulation maintained with moderate arthrosis. SOFT TISSUES AND SPINAL CANAL: Nonacute. Moderate calcific atherosclerosis carotid bifurcations. DISC LEVELS: No high-grade osseous canal stenosis or neural foraminal narrowing. UPPER CHEST: Biapical pleuroparenchymal scarring with RIGHT apical calcified pleural plaque. OTHER: None. IMPRESSION: CT HEAD: 1. No acute intracranial process. Moderate LEFT frontal scalp hematoma. 2. Stable examination including severe chronic small vessel ischemic changes and old lacunar infarcts. CT MAXILLOFACIAL: 1. No facial fracture. CT CERVICAL SPINE: 1. No fracture. Stable grade 1 C5-6 anterolisthesis on a degenerative basis. Electronically Signed   By: Awilda Metro M.D.   On: 09/20/2018 01:00   Ct Cervical Spine Wo Contrast  Result Date: 09/20/2018 CLINICAL DATA:  Larey Seat, hit LEFT forehead. Unresponsive. History of hypertension and dementia. EXAM: CT HEAD WITHOUT CONTRAST CT MAXILLOFACIAL WITHOUT CONTRAST CT CERVICAL SPINE WITHOUT CONTRAST TECHNIQUE: Multidetector CT  imaging of the head, cervical spine, and maxillofacial structures were performed using the standard protocol without intravenous contrast. Multiplanar CT image reconstructions of the cervical spine and maxillofacial structures were also generated. COMPARISON:  CT HEAD and cervical spine July 17, 2018 FINDINGS: CT HEAD FINDINGS BRAIN: No intraparenchymal hemorrhage, mass effect nor midline shift. No acute large vascular territory infarcts. Confluent supratentorial white matter hypodensities unchanged. Old basal ganglia lacunar infarcts. No abnormal extra-axial fluid collections. Basal cisterns are patent. VASCULAR: Moderate to severe calcific atherosclerosis carotid siphon and included vertebral arteries. SKULL/SOFT TISSUES: No skull fracture. Moderate LEFT frontal scalp hematoma without subcutaneous gas or radiopaque foreign bodies. OTHER: None. CT MAXILLOFACIAL FINDINGS OSSEOUS: No acute facial fracture. The mandible is intact, the condyles are located. No destructive bony lesions. Severe temporomandibular osteoarthrosis. Multiple absent teeth. ORBITS: Ocular globes and orbital contents are nonacute. Status post bilateral ocular lens implants. 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 Status post FESS. Mild fronto ethmoidal mucosal thickening. Mastoid air cells are well aerated. SOFT TISSUES: No significant soft tissue swelling. No subcutaneous gas or radiopaque foreign bodies. CT CERVICAL SPINE FINDINGS ALIGNMENT: Maintenance of cervical lordosis. Minimal grade 1 C 5 6 anterolisthesis, unchanged. SKULL BASE AND VERTEBRAE: Cervical vertebral bodies intact. Stable severe C6-7 degenerative disc, moderate C3-4 and C5-6. Osteopenia without destructive bony lesions. C1-2 articulation maintained with moderate arthrosis. SOFT TISSUES AND SPINAL CANAL: Nonacute. Moderate calcific atherosclerosis carotid bifurcations. DISC LEVELS: No high-grade osseous canal stenosis or neural foraminal  narrowing. UPPER CHEST: Biapical pleuroparenchymal scarring with RIGHT apical calcified pleural plaque. OTHER: None. IMPRESSION: CT HEAD: 1. No acute intracranial process. Moderate LEFT frontal scalp hematoma. 2. Stable examination including severe chronic small vessel ischemic changes and old lacunar infarcts. CT MAXILLOFACIAL: 1. No facial fracture. CT CERVICAL SPINE: 1. No fracture. Stable grade 1 C5-6 anterolisthesis on a degenerative basis. Electronically Signed   By: Awilda Metro M.D.   On: 09/20/2018 01:00   Dg Pelvis Portable  Result Date: 09/20/2018 CLINICAL DATA:  Larey Seat at assisted living facility. EXAM: PORTABLE PELVIS 1-2 VIEWS COMPARISON:  RIGHT hip radiograph August 09, 2018 FINDINGS: No acute fracture deformity. Status post RIGHT femur ORIF with residual fracture line. Imaged hardware is intact. Old RIGHT superior and. Pubic rami fractures. Osteopenia without destructive bony lesions. Advanced aortoiliac calcifications. IMPRESSION: 1. No acute fracture deformity or dislocation. Osteopenia decreases sensitivity for acute nondisplaced fractures.  2. Status post RIGHT femur ORIF with residual fracture line. 3.  Aortic Atherosclerosis (ICD10-I70.0). Electronically Signed   By: Awilda Metro M.D.   On: 09/20/2018 00:27   Dg Chest Portable 1 View  Result Date: 09/20/2018 CLINICAL DATA:  One 85 year old female with fall. EXAM: PORTABLE CHEST 1 VIEW COMPARISON:  Chest radiograph dated 07/17/2018 FINDINGS: There is no focal consolidation, pleural effusion, or pneumothorax. Mild diffuse interstitial coarsening. Stable retrocardiac density, possibly a hiatal hernia. Stable cardiac silhouette. Atherosclerotic calcification of the aorta. Osteopenia. No acute osseous pathology. Probable old healed left humeral neck fracture. IMPRESSION: 1. No acute cardiopulmonary process. 2. No acute rib fracture or bone destruction. Electronically Signed   By: Elgie Collard M.D.   On: 09/20/2018 00:24    Ct Maxillofacial Wo Contrast  Result Date: 09/20/2018 CLINICAL DATA:  Larey Seat, hit LEFT forehead. Unresponsive. History of hypertension and dementia. EXAM: CT HEAD WITHOUT CONTRAST CT MAXILLOFACIAL WITHOUT CONTRAST CT CERVICAL SPINE WITHOUT CONTRAST TECHNIQUE: Multidetector CT imaging of the head, cervical spine, and maxillofacial structures were performed using the standard protocol without intravenous contrast. Multiplanar CT image reconstructions of the cervical spine and maxillofacial structures were also generated. COMPARISON:  CT HEAD and cervical spine July 17, 2018 FINDINGS: CT HEAD FINDINGS BRAIN: No intraparenchymal hemorrhage, mass effect nor midline shift. No acute large vascular territory infarcts. Confluent supratentorial white matter hypodensities unchanged. Old basal ganglia lacunar infarcts. No abnormal extra-axial fluid collections. Basal cisterns are patent. VASCULAR: Moderate to severe calcific atherosclerosis carotid siphon and included vertebral arteries. SKULL/SOFT TISSUES: No skull fracture. Moderate LEFT frontal scalp hematoma without subcutaneous gas or radiopaque foreign bodies. OTHER: None. CT MAXILLOFACIAL FINDINGS OSSEOUS: No acute facial fracture. The mandible is intact, the condyles are located. No destructive bony lesions. Severe temporomandibular osteoarthrosis. Multiple absent teeth. ORBITS: Ocular globes and orbital contents are nonacute. Status post bilateral ocular lens implants. 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 Status post FESS. Mild fronto ethmoidal mucosal thickening. Mastoid air cells are well aerated. SOFT TISSUES: No significant soft tissue swelling. No subcutaneous gas or radiopaque foreign bodies. CT CERVICAL SPINE FINDINGS ALIGNMENT: Maintenance of cervical lordosis. Minimal grade 1 C 5 6 anterolisthesis, unchanged. SKULL BASE AND VERTEBRAE: Cervical vertebral bodies intact. Stable severe C6-7 degenerative disc, moderate C3-4  and C5-6. Osteopenia without destructive bony lesions. C1-2 articulation maintained with moderate arthrosis. SOFT TISSUES AND SPINAL CANAL: Nonacute. Moderate calcific atherosclerosis carotid bifurcations. DISC LEVELS: No high-grade osseous canal stenosis or neural foraminal narrowing. UPPER CHEST: Biapical pleuroparenchymal scarring with RIGHT apical calcified pleural plaque. OTHER: None. IMPRESSION: CT HEAD: 1. No acute intracranial process. Moderate LEFT frontal scalp hematoma. 2. Stable examination including severe chronic small vessel ischemic changes and old lacunar infarcts. CT MAXILLOFACIAL: 1. No facial fracture. CT CERVICAL SPINE: 1. No fracture. Stable grade 1 C5-6 anterolisthesis on a degenerative basis. Electronically Signed   By: Awilda Metro M.D.   On: 09/20/2018 01:00    EKG: Independently reviewed.  Normal sinus rhythm.  QTC is 482 ms.  Assessment/Plan Principal Problem:   Unresponsive episode Active Problems:   Seizure (HCC)   Essential hypertension   Urinary tract infection without hematuria   Hypothyroidism    1. Unresponsive episode suspect most likely could be postictal given history of seizures.  Discussed with on-call neurologist Dr. Amada Jupiter who advised to increase Keppra dose which patient is taking 500 twice daily to 750 twice daily.  Will monitor in telemetry. 2. Possible UTI on ceftriaxone follow urine cultures. 3.  Hypertension on amlodipine. 4. Hypothyroidism on Synthroid. 5. Dementia on Namenda.  Medications has to be verified.   DVT prophylaxis: SCD since patient has a small left periorbital hematoma. Code Status: DNR. Family Communication: No family at the bedside. Disposition Plan: Skilled nursing facility. Consults called: Physical therapy.  Discussed with neurologist. Admission status: Observation.   Eduard Clos MD [facility] Pager [phone].  If 7PM-7AM, please contact night-coverage [email or web address] Password  [email or web address]  09/20/2018, 5:04 AM

## 2018-09-20 NOTE — TOC Initial Note (Signed)
Transition of Care [facility]) - Initial/Assessment Note    Patient Details  Name: Kenae Malachi MRN: [id number] Date of Birth: [birth year]  [facility] CM/SW Contact:    Gildardo Griffes, LCSW Phone Number: 09/20/2018, 12:35 PM  Clinical Narrative:                 CSW consulted with patient's daughter Bonita Quin who reports patient is from [facility] memory care and patient will return there. [facility] is expecting patient to be discharged back to them today. Patient's daughter expressed no questions or concerns at this time and is ready for her mother to return to [facility].   Expected Discharge Plan: Skilled Nursing Facility Barriers to Discharge: No Barriers Identified   Patient Goals and CMS Choice Patient states their goals for this hospitalization and ongoing recovery are:: to go back to [email or web address].gov Compare Post Acute Care list provided to:: Patient Represenative (must comment)(Linda, patient's daughter) Choice offered to / list presented to : Adult Children  Expected Discharge Plan and Services Expected Discharge Plan: Skilled Nursing Facility   Discharge Planning Services: NA Post Acute Care Choice: Skilled Nursing Facility Living arrangements for the past 2 months: Assisted Living Facility(from [facility] memory care) Expected Discharge Date: 09/20/18               DME Arranged: N/A DME Agency: NA HH Arranged: NA HH Agency: NA  Prior Living Arrangements/Services Living arrangements for the past 2 months: Assisted Living Facility(from [facility] memory care) Lives with:: Self Patient language and need for interpreter reviewed:: Yes Do you feel safe going back to the place where you live?: Yes        Care giver support system in place?: Yes (comment)   Criminal Activity/Legal Involvement Pertinent to Current Situation/Hospitalization: No - Comment as needed  Activities of Daily Living      Permission Sought/Granted Permission sought to  share information with : Case Manager, Magazine features editor, Family Supports Permission granted to share information with : Yes, Verbal Permission Granted  Share Information with NAME: Bonita Quin  Permission granted to share info w AGENCY: SNFs  Permission granted to share info w Relationship: daughter  Permission granted to share info w Contact Information: [phone]  Emotional Assessment Appearance:: Appears older than stated age Attitude/Demeanor/Rapport: Unable to Assess Affect (typically observed): Unable to Assess Orientation: : Oriented to Self Alcohol / Substance Use: Not Applicable Psych Involvement: No (comment)  Admission diagnosis:  Fall, initial encounter [W19.XXXA] Urinary tract infection without hematuria, site unspecified [N39.0] Patient Active Problem List   Diagnosis Date Noted  . Unresponsive episode 09/20/2018  . Urinary tract infection without hematuria 09/20/2018  . Hypothyroidism 09/20/2018  . Closed comminuted intertrochanteric fracture of proximal end of right femur, with routine healing, subsequent encounter 08/09/2018  . Protein-calorie malnutrition, severe 07/02/2018  . Hip fracture, right, closed, initial encounter (HCC) 06/30/2018  . Seizure (HCC) 06/30/2018  . Essential hypertension 06/30/2018  . Normochromic normocytic anemia 06/30/2018  . Hip fracture (HCC) 06/30/2018   PCP:  System, Pcp Not In Pharmacy:  No Pharmacies Listed    Social Determinants of Health (SDOH) Interventions    Readmission Risk Interventions No flowsheet data found.

## 2018-09-20 NOTE — ED Notes (Signed)
Patient transported to CT 

## 2018-09-20 NOTE — Discharge Instructions (Signed)
Follow with Primary MD in 3 days   Get CBC, CMP, checked  by Primary MD next visit.    Activity: As tolerated with Full fall precautions use walker/cane & assistance as needed, increased risk for fall   Disposition ALF  Diet: Heart Healthy  , with feeding assistance and aspiration precautions.  For Heart failure patients - Check your Weight same time everyday, if you gain over 2 pounds, or you develop in leg swelling, experience more shortness of breath or chest pain, call your Primary MD immediately. Follow Cardiac Low Salt Diet and 1.5 lit/day fluid restriction.   On your next visit with your primary care physician please Get Medicines reviewed and adjusted.   Please request your Prim.MD to go over all Hospital Tests and Procedure/Radiological results at the follow up, please get all Hospital records sent to your Prim MD by signing hospital release before you go home.   If you experience worsening of your admission symptoms, develop shortness of breath, life threatening emergency, suicidal or homicidal thoughts you must seek medical attention immediately by calling 911 or calling your MD immediately  if symptoms less severe.  You Must read complete instructions/literature along with all the possible adverse reactions/side effects for all the Medicines you take and that have been prescribed to you. Take any new Medicines after you have completely understood and accpet all the possible adverse reactions/side effects.   Do not drive, operating heavy machinery, perform activities at heights, swimming or participation in water activities or provide baby sitting services if your were admitted for syncope or siezures until you have seen by Primary MD or a Neurologist and advised to do so again.  Do not drive when taking Pain medications.    Do not take more than prescribed Pain, Sleep and Anxiety Medications  Special Instructions: If you have smoked or chewed Tobacco  in the last 2 yrs  please stop smoking, stop any regular Alcohol  and or any Recreational drug use.  Wear Seat belts while driving.   Please note  You were cared for by a hospitalist during your hospital stay. If you have any questions about your discharge medications or the care you received while you were in the hospital after you are discharged, you can call the unit and asked to speak with the hospitalist on call if the hospitalist that took care of you is not available. Once you are discharged, your primary care physician will handle any further medical issues. Please note that NO REFILLS for any discharge medications will be authorized once you are discharged, as it is imperative that you return to your primary care physician (or establish a relationship with a primary care physician if you do not have one) for your aftercare needs so that they can reassess your need for medications and monitor your lab values.

## 2018-09-20 NOTE — ED Provider Notes (Signed)
[facility] EMERGENCY DEPARTMENT Provider Note   CSN: [id number] Arrival date & time: 09/19/18  2349    History   Chief Complaint Chief Complaint  Patient presents with   Altered Mental Status    HPI Rachel Barry is a 85 y.o. female.     Level 5 caveat for altered mental status.  Patient from assisted living facility after unwitnessed fall.  She apparently fell and struck the left side of her face and forehead.  She was doing okay for several hours afterwards but then became increasingly confused and decreasingly responsive.  On arrival she moans and to painful stimuli but does not follow commands or open her eyes.  There is a large hematoma to her forehead and side of her face.  She moans to painful stimuli.  She does have a DNR in place.  No known anticoagulation. Patient does have a history of dementia, glaucoma, hypertension, seizure disorder.   Altered Mental Status    Past Medical History:  Diagnosis Date   Anxiety    Arthritis    Dementia (HCC)    Glaucoma    Hypertension    Seizures (HCC)    Thyroid disease     Patient Active Problem List   Diagnosis Date Noted   Closed comminuted intertrochanteric fracture of proximal end of right femur, with routine healing, subsequent encounter 08/09/2018   Protein-calorie malnutrition, severe 07/02/2018   Hip fracture, right, closed, initial encounter (HCC) 06/30/2018   Seizure (HCC) 06/30/2018   Essential hypertension 06/30/2018   Normochromic normocytic anemia 06/30/2018   Hip fracture (HCC) 06/30/2018    Past Surgical History:  Procedure Laterality Date   HERNIA REPAIR     INTRAMEDULLARY (IM) NAIL INTERTROCHANTERIC Right 06/30/2018   Procedure: INTRAMEDULLARY (IM) NAIL RIGHT INTERTROCHANTRIC;  Surgeon: Tarry Kos, MD;  Location: [facility] OR;  Service: Orthopedics;  Laterality: Right;   NASAL SINUS SURGERY       OB History    Gravida  3   Para      Term      Preterm      AB      Living  3     SAB      TAB      Ectopic      Multiple      Live Births               Home Medications    Prior to Admission medications   Medication Sig Start Date End Date Taking? Authorizing Provider  acetaminophen (TYLENOL) 325 MG tablet Take 325 mg by mouth 3 (three) times daily.     [provider]  acetaminophen (TYLENOL) 500 MG tablet Take 500 mg by mouth daily as needed for moderate pain.    [provider]  amLODipine (NORVASC) 5 MG tablet Take 5 mg by mouth daily.     [provider]  aspirin EC 81 MG tablet Take 81 mg by mouth daily.    [provider]  busPIRone (BUSPAR) 10 MG tablet Take 10 mg by mouth 2 (two) times daily with a meal.     [provider]  calcitRIOL (ROCALTROL) 0.25 MCG capsule Take 0.25 mcg by mouth daily. 03/10/15   [provider]  Calcium Citrate-Vitamin D 315-250 MG-UNIT TABS Take 1 tablet by mouth daily.    [provider]  Cranberry-Vitamin C-Inulin (UTI-STAT) LIQD Take 30 mLs by mouth daily.    [provider]  docusate sodium (COLACE) 100  MG capsule Take 100 mg by mouth daily.    [provider]  dorzolamidel-timolol (COSOPT PF) 22.3-6.8 MG/ML SOLN ophthalmic solution Place 1 drop into both eyes 2 (two) times daily.    [provider]  enoxaparin (LOVENOX) 40 MG/0.4ML injection Inject 0.4 mLs (40 mg total) into the skin daily. Patient not taking: Reported on 07/17/2018 06/30/18   Tarry Kos, MD  ferrous sulfate 325 (65 FE) MG tablet Take 1 tablet (325 mg total) by mouth 2 (two) times daily with a meal for 30 days. 07/03/18 08/02/18  Amin, Loura Halt, MD  FLUoxetine (PROZAC) 20 MG capsule Take 20 mg by mouth daily.     [provider]  guaifenesin (ROBITUSSIN) 100 MG/5ML syrup Take 200 mg by mouth 4 (four) times daily as needed for cough.    [provider]  levETIRAcetam (KEPPRA) 500 MG tablet Take 500 mg by mouth 2 (two)  times daily.    [provider]  levothyroxine (SYNTHROID, LEVOTHROID) 50 MCG tablet Take 50 mcg by mouth every evening.  04/03/15   [provider]  LORazepam (ATIVAN) 0.5 MG tablet Take 0.5 mg by mouth every 6 (six) hours as needed for anxiety.    [provider]  memantine (NAMENDA XR) 28 MG CP24 24 hr capsule Take 28 mg by mouth daily.     [provider]  methocarbamol (ROBAXIN) 500 MG tablet Take 1 tablet (500 mg total) by mouth every 6 (six) hours as needed for up to 20 doses for muscle spasms. Patient not taking: Reported on 07/17/2018 07/03/18   Dimple Nanas, MD  Multiple Vitamin (MULTIVITAMIN WITH MINERALS) TABS tablet Take 1 tablet by mouth daily.    [provider]  NUTRITIONAL SUPPLEMENTS PO Take 120 mLs by mouth 2 (two) times daily. MedPass 2.0    [provider]  oxyCODONE-acetaminophen (PERCOCET) 5-325 MG tablet Take 1-2 tablets by mouth every 4 (four) hours as needed for severe pain. Patient taking differently: Take 1-2 tablets by mouth every 4 (four) hours as needed (1 tablet - mild to moderate pain, 2 tablets - severe pain).  06/30/18   Tarry Kos, MD  polyethylene glycol (MIRALAX / Ethelene Hal) packet Take 17 g by mouth daily.    [provider]  triamcinolone cream (KENALOG) 0.1 % Apply 1 application topically daily as needed (rash/irritation).     [provider]    Family History Family History  Problem Relation Age of Onset   Rectal cancer Father     Social History Social History   Tobacco Use   Smoking status: Never Smoker   Smokeless tobacco: Never Used  Substance Use Topics   Alcohol use: No   Drug use: No     Allergies   Ciprofloxacin hcl   Review of Systems Review of Systems  Unable to perform ROS: Mental status change     Physical Exam Updated Vital Signs BP (!) 184/86 (BP Location: Right Arm)    Pulse 66    Temp 97.9 F (36.6 C) (Rectal)    Resp 16    Wt 36.3 kg     SpO2 100%   Physical Exam Vitals signs and nursing note reviewed.  Constitutional:      General: She is not in acute distress.    Appearance: Normal appearance. She is well-developed and normal weight.     Comments: Resting with eyes closed.  She moans to painful stimuli  HENT:     Head: Normocephalic.  Comments: Hematoma to left forehead as well as left temple    Mouth/Throat:     Mouth: Mucous membranes are moist.     Pharynx: No oropharyngeal exudate.  Eyes:     Conjunctiva/sclera: Conjunctivae normal.     Pupils: Pupils are equal, round, and reactive to light.     Comments: Left-sided gaze deviation.  Pupils are constricted bilaterally Left cornea appears to be partly opacified Would not cooperate to assess intraocular movements  Neck:     Musculoskeletal: Normal range of motion and neck supple.     Comments: No C-spine tenderness Cardiovascular:     Rate and Rhythm: Normal rate and regular rhythm.     Heart sounds: Normal heart sounds. No murmur.  Pulmonary:     Effort: Pulmonary effort is normal. No respiratory distress.     Breath sounds: Normal breath sounds.  Chest:     Chest wall: No tenderness.  Abdominal:     Palpations: Abdomen is soft.     Tenderness: There is no abdominal tenderness. There is no guarding or rebound.  Musculoskeletal: Normal range of motion.        General: No tenderness.     Comments: No T or L-spine tenderness Pelvis stable. FROM hips without pain  Skin:    General: Skin is warm.     Capillary Refill: Capillary refill takes less than 2 seconds.  Neurological:     Motor: No abnormal muscle tone.     Comments: Patient moans to painful stimuli, will not follow commands, some spontaneous movements of arms and legs      ED Treatments / Results  Labs (all labs ordered are listed, but only abnormal results are displayed) Labs Reviewed  CBC WITH DIFFERENTIAL/PLATELET - Abnormal; Notable for the following components:      Result Value     MCV 101.9 (*)    All other components within normal limits  COMPREHENSIVE METABOLIC PANEL - Abnormal; Notable for the following components:   Total Protein 6.4 (*)    AST 66 (*)    ALT 52 (*)    All other components within normal limits  URINALYSIS, ROUTINE W REFLEX MICROSCOPIC - Abnormal; Notable for the following components:   APPearance CLOUDY (*)    Hgb urine dipstick SMALL (*)    Protein, ur 100 (*)    WBC, UA >50 (*)    Bacteria, UA MANY (*)    All other components within normal limits  URINE CULTURE  TROPONIN I    EKG EKG Interpretation  Date/Time:  Monday September 19 2018 23:52:21 EDT Ventricular Rate:  68 PR Interval:    QRS Duration: 85 QT Interval:  453 QTC Calculation: 482 R Axis:   68 Text Interpretation:  Sinus rhythm Short PR interval Probable left atrial enlargement Left ventricular hypertrophy Artifact No significant change was found Confirmed by Glynn Octave [phone]) on 09/20/2018 3:50:11 AM   Radiology Ct Head Wo Contrast  Result Date: 09/20/2018 CLINICAL DATA:  Larey Seat, hit LEFT forehead. Unresponsive. History of hypertension and dementia. EXAM: CT HEAD WITHOUT CONTRAST CT MAXILLOFACIAL WITHOUT CONTRAST CT CERVICAL SPINE WITHOUT CONTRAST TECHNIQUE: Multidetector CT imaging of the head, cervical spine, and maxillofacial structures were performed using the standard protocol without intravenous contrast. Multiplanar CT image reconstructions of the cervical spine and maxillofacial structures were also generated. COMPARISON:  CT HEAD and cervical spine July 17, 2018 FINDINGS: CT HEAD FINDINGS BRAIN: No intraparenchymal hemorrhage, mass effect nor midline shift. No acute large vascular territory infarcts.  Confluent supratentorial white matter hypodensities unchanged. Old basal ganglia lacunar infarcts. No abnormal extra-axial fluid collections. Basal cisterns are patent. VASCULAR: Moderate to severe calcific atherosclerosis carotid siphon and included vertebral  arteries. SKULL/SOFT TISSUES: No skull fracture. Moderate LEFT frontal scalp hematoma without subcutaneous gas or radiopaque foreign bodies. OTHER: None. CT MAXILLOFACIAL FINDINGS OSSEOUS: No acute facial fracture. The mandible is intact, the condyles are located. No destructive bony lesions. Severe temporomandibular osteoarthrosis. Multiple absent teeth. ORBITS: Ocular globes and orbital contents are nonacute. Status post bilateral ocular lens implants. 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 Status post FESS. Mild fronto ethmoidal mucosal thickening. Mastoid air cells are well aerated. SOFT TISSUES: No significant soft tissue swelling. No subcutaneous gas or radiopaque foreign bodies. CT CERVICAL SPINE FINDINGS ALIGNMENT: Maintenance of cervical lordosis. Minimal grade 1 C 5 6 anterolisthesis, unchanged. SKULL BASE AND VERTEBRAE: Cervical vertebral bodies intact. Stable severe C6-7 degenerative disc, moderate C3-4 and C5-6. Osteopenia without destructive bony lesions. C1-2 articulation maintained with moderate arthrosis. SOFT TISSUES AND SPINAL CANAL: Nonacute. Moderate calcific atherosclerosis carotid bifurcations. DISC LEVELS: No high-grade osseous canal stenosis or neural foraminal narrowing. UPPER CHEST: Biapical pleuroparenchymal scarring with RIGHT apical calcified pleural plaque. OTHER: None. IMPRESSION: CT HEAD: 1. No acute intracranial process. Moderate LEFT frontal scalp hematoma. 2. Stable examination including severe chronic small vessel ischemic changes and old lacunar infarcts. CT MAXILLOFACIAL: 1. No facial fracture. CT CERVICAL SPINE: 1. No fracture. Stable grade 1 C5-6 anterolisthesis on a degenerative basis. Electronically Signed   By: Awilda Metro M.D.   On: 09/20/2018 01:00   Ct Cervical Spine Wo Contrast  Result Date: 09/20/2018 CLINICAL DATA:  Larey Seat, hit LEFT forehead. Unresponsive. History of hypertension and dementia. EXAM: CT HEAD WITHOUT CONTRAST CT  MAXILLOFACIAL WITHOUT CONTRAST CT CERVICAL SPINE WITHOUT CONTRAST TECHNIQUE: Multidetector CT imaging of the head, cervical spine, and maxillofacial structures were performed using the standard protocol without intravenous contrast. Multiplanar CT image reconstructions of the cervical spine and maxillofacial structures were also generated. COMPARISON:  CT HEAD and cervical spine July 17, 2018 FINDINGS: CT HEAD FINDINGS BRAIN: No intraparenchymal hemorrhage, mass effect nor midline shift. No acute large vascular territory infarcts. Confluent supratentorial white matter hypodensities unchanged. Old basal ganglia lacunar infarcts. No abnormal extra-axial fluid collections. Basal cisterns are patent. VASCULAR: Moderate to severe calcific atherosclerosis carotid siphon and included vertebral arteries. SKULL/SOFT TISSUES: No skull fracture. Moderate LEFT frontal scalp hematoma without subcutaneous gas or radiopaque foreign bodies. OTHER: None. CT MAXILLOFACIAL FINDINGS OSSEOUS: No acute facial fracture. The mandible is intact, the condyles are located. No destructive bony lesions. Severe temporomandibular osteoarthrosis. Multiple absent teeth. ORBITS: Ocular globes and orbital contents are nonacute. Status post bilateral ocular lens implants. 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 Status post FESS. Mild fronto ethmoidal mucosal thickening. Mastoid air cells are well aerated. SOFT TISSUES: No significant soft tissue swelling. No subcutaneous gas or radiopaque foreign bodies. CT CERVICAL SPINE FINDINGS ALIGNMENT: Maintenance of cervical lordosis. Minimal grade 1 C 5 6 anterolisthesis, unchanged. SKULL BASE AND VERTEBRAE: Cervical vertebral bodies intact. Stable severe C6-7 degenerative disc, moderate C3-4 and C5-6. Osteopenia without destructive bony lesions. C1-2 articulation maintained with moderate arthrosis. SOFT TISSUES AND SPINAL CANAL: Nonacute. Moderate calcific atherosclerosis  carotid bifurcations. DISC LEVELS: No high-grade osseous canal stenosis or neural foraminal narrowing. UPPER CHEST: Biapical pleuroparenchymal scarring with RIGHT apical calcified pleural plaque. OTHER: None. IMPRESSION: CT HEAD: 1. No acute intracranial process. Moderate LEFT frontal scalp hematoma. 2. Stable examination including  severe chronic small vessel ischemic changes and old lacunar infarcts. CT MAXILLOFACIAL: 1. No facial fracture. CT CERVICAL SPINE: 1. No fracture. Stable grade 1 C5-6 anterolisthesis on a degenerative basis. Electronically Signed   By: Awilda Metro M.D.   On: 09/20/2018 01:00   Dg Pelvis Portable  Result Date: 09/20/2018 CLINICAL DATA:  Larey Seat at assisted living facility. EXAM: PORTABLE PELVIS 1-2 VIEWS COMPARISON:  RIGHT hip radiograph August 09, 2018 FINDINGS: No acute fracture deformity. Status post RIGHT femur ORIF with residual fracture line. Imaged hardware is intact. Old RIGHT superior and. Pubic rami fractures. Osteopenia without destructive bony lesions. Advanced aortoiliac calcifications. IMPRESSION: 1. No acute fracture deformity or dislocation. Osteopenia decreases sensitivity for acute nondisplaced fractures. 2. Status post RIGHT femur ORIF with residual fracture line. 3.  Aortic Atherosclerosis (ICD10-I70.0). Electronically Signed   By: Awilda Metro M.D.   On: 09/20/2018 00:27   Dg Chest Portable 1 View  Result Date: 09/20/2018 CLINICAL DATA:  One 85 year old female with fall. EXAM: PORTABLE CHEST 1 VIEW COMPARISON:  Chest radiograph dated 07/17/2018 FINDINGS: There is no focal consolidation, pleural effusion, or pneumothorax. Mild diffuse interstitial coarsening. Stable retrocardiac density, possibly a hiatal hernia. Stable cardiac silhouette. Atherosclerotic calcification of the aorta. Osteopenia. No acute osseous pathology. Probable old healed left humeral neck fracture. IMPRESSION: 1. No acute cardiopulmonary process. 2. No acute rib fracture or  bone destruction. Electronically Signed   By: Elgie Collard M.D.   On: 09/20/2018 00:24   Ct Maxillofacial Wo Contrast  Result Date: 09/20/2018 CLINICAL DATA:  Larey Seat, hit LEFT forehead. Unresponsive. History of hypertension and dementia. EXAM: CT HEAD WITHOUT CONTRAST CT MAXILLOFACIAL WITHOUT CONTRAST CT CERVICAL SPINE WITHOUT CONTRAST TECHNIQUE: Multidetector CT imaging of the head, cervical spine, and maxillofacial structures were performed using the standard protocol without intravenous contrast. Multiplanar CT image reconstructions of the cervical spine and maxillofacial structures were also generated. COMPARISON:  CT HEAD and cervical spine July 17, 2018 FINDINGS: CT HEAD FINDINGS BRAIN: No intraparenchymal hemorrhage, mass effect nor midline shift. No acute large vascular territory infarcts. Confluent supratentorial white matter hypodensities unchanged. Old basal ganglia lacunar infarcts. No abnormal extra-axial fluid collections. Basal cisterns are patent. VASCULAR: Moderate to severe calcific atherosclerosis carotid siphon and included vertebral arteries. SKULL/SOFT TISSUES: No skull fracture. Moderate LEFT frontal scalp hematoma without subcutaneous gas or radiopaque foreign bodies. OTHER: None. CT MAXILLOFACIAL FINDINGS OSSEOUS: No acute facial fracture. The mandible is intact, the condyles are located. No destructive bony lesions. Severe temporomandibular osteoarthrosis. Multiple absent teeth. ORBITS: Ocular globes and orbital contents are nonacute. Status post bilateral ocular lens implants. 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 Status post FESS. Mild fronto ethmoidal mucosal thickening. Mastoid air cells are well aerated. SOFT TISSUES: No significant soft tissue swelling. No subcutaneous gas or radiopaque foreign bodies. CT CERVICAL SPINE FINDINGS ALIGNMENT: Maintenance of cervical lordosis. Minimal grade 1 C 5 6 anterolisthesis, unchanged. SKULL BASE AND  VERTEBRAE: Cervical vertebral bodies intact. Stable severe C6-7 degenerative disc, moderate C3-4 and C5-6. Osteopenia without destructive bony lesions. C1-2 articulation maintained with moderate arthrosis. SOFT TISSUES AND SPINAL CANAL: Nonacute. Moderate calcific atherosclerosis carotid bifurcations. DISC LEVELS: No high-grade osseous canal stenosis or neural foraminal narrowing. UPPER CHEST: Biapical pleuroparenchymal scarring with RIGHT apical calcified pleural plaque. OTHER: None. IMPRESSION: CT HEAD: 1. No acute intracranial process. Moderate LEFT frontal scalp hematoma. 2. Stable examination including severe chronic small vessel ischemic changes and old lacunar infarcts. CT MAXILLOFACIAL: 1. No facial fracture. CT CERVICAL SPINE:  1. No fracture. Stable grade 1 C5-6 anterolisthesis on a degenerative basis. Electronically Signed   By: Awilda Metro M.D.   On: 09/20/2018 01:00    Procedures Procedures (including critical care time)  Medications Ordered in ED Medications - No data to display   Initial Impression / Assessment and Plan / ED Course  I have reviewed the triage vital signs and the nursing notes.  Pertinent labs & imaging results that were available during my care of the patient were reviewed by me and considered in my medical decision making (see chart for details).       Unwitnessed fall with head trauma with decreased LOC since.  She moans to painful stimuli.  She does have a DNR in place.  Evidence of head trauma with bruising of forehead and face.  Discussed with patient's power of attorney and son Gerlene Burdock on arrival.  He confirms DNR.  Discussed that CT imaging can be obtained but it is unclear whether patient will be a surgical candidate.  He would like to proceed with imaging.  D/w Dr. Donell Beers who agrees with not activating a leveled trauma and medical admission if necessary.  Traumatic imaging is negative.  No intracerebral hemorrhage, facial fracture or C-spine  fracture.  Patient is becoming more awake and intermittently agitated.  She is moving all extremities but still has difficulty following commands.  She does have a history of seizures and question whether she could have had one earlier.  Will load with Keppra.  Does have a urinary tract infection based on her work-up and will treat with Rocephin while culture pending.  Unclear if she is back to baseline.  Will admit for observation.  Discussed with her son Gerlene Burdock. Admission discussed with Dr. Toniann Fail.  ED ECG REPORT   Date: 09/20/2018  Rate: 68  Rhythm: normal sinus rhythm  QRS Axis: normal  Intervals: normal  ST/T Wave abnormalities: normal  Conduction Disutrbances:none  Narrative Interpretation:   Old EKG Reviewed: none available  I have personally reviewed the EKG tracing and agree with the computerized printout as noted.  Final Clinical Impressions(s) / ED Diagnoses   Final diagnoses:  Fall, initial encounter  Urinary tract infection without hematuria, site unspecified    ED Discharge Orders    None       Adela Esteban, Jeannett Senior, MD 09/20/18 [phone]

## 2018-09-20 NOTE — Discharge Summary (Addendum)
Rachel Barry, is a 85 y.o. female  DOB [birth year]  MRN [id number].  Admission date:  09/19/2018  Admitting Physician  Eduard Clos, MD  Discharge Date:  09/20/2018   Primary MD  System, Pcp Not In   Addendum: URINE Culture growing Aerococcus viridans, discussed with Dr. Luciana Axe, doxycycline woiuld be better option than Vantin, called [facility] facility, discussed with nurse supervisor,Ms Ann,  her antibiotic has been changed to doxycycline 100 mg p.o. twice daily for 5 days, she will stop vantin  Recommendations for primary care physician for things to follow:  -Please check CBC, BMP during next visit -Please follow-up on the final results of urine cultures   Admission Diagnosis  Fall, initial encounter [W19.XXXA] Urinary tract infection without hematuria, site unspecified [N39.0]   Discharge Diagnosis  Fall, initial encounter [W19.XXXA] Urinary tract infection without hematuria, site unspecified [N39.0]    Principal Problem:   Unresponsive episode Active Problems:   Seizure (HCC)   Essential hypertension   Urinary tract infection without hematuria   Hypothyroidism      Past Medical History:  Diagnosis Date   Anxiety    Arthritis    Dementia (HCC)    Glaucoma    Hypertension    Seizures (HCC)    Thyroid disease     Past Surgical History:  Procedure Laterality Date   HERNIA REPAIR     INTRAMEDULLARY (IM) NAIL INTERTROCHANTERIC Right 06/30/2018   Procedure: INTRAMEDULLARY (IM) NAIL RIGHT INTERTROCHANTRIC;  Surgeon: Tarry Kos, MD;  Location: [facility] OR;  Service: Orthopedics;  Laterality: Right;   NASAL SINUS SURGERY         History of present illness and  Hospital Course:     Kindly see H&P for history of present illness and admission details, please review complete Labs, Consult reports and Test reports for all details in brief  HPI  from the history and physical  done on the day of admission 09/20/2018  HPI: Rachel Barry is a 85 y.o. female with history of dementia, hypertension, hypothyroidism and seizure was brought to the ER after patient had an unwitnessed fall.  As per the report from the ER physician patient was doing fine after the fall but later became more altered in mental status and lethargic and was brought to the ER.  ED Course: In the ER patient was initially minimally responsive but became more alert awake.  Back to her baseline.  CT scan of the head maxillofacial C-spine was done.  Showed frontal scalp hematoma otherwise nothing acute per x-ray pelvis was negative.  Chest x-ray did not show anything acute patient was not febrile.  EKG shows normal sinus rhythm.  Given the history of seizures patient probably could have had a seizure and Keppra 1 g was loaded and admitted for further observation.  Since there was concern for UTI ceftriaxone was given.    Hospital Course   1. Unresponsive episode, it was noted after her fall, no clear circumstances, this is most likely related to UTI, have discussed with  her son, reports she had multiple similar events over the years, with similar falls, was unrelated to seizures, report she did not have any seizures for 20 years, and would like her Keppra dose remains unchanged, which I do think is very appropriate, as very low clinical suspicion for seizures, she was seen by PT during hospital stay, she will be discharged back to ALF, memory unit, especially she is having 24/7 care Rachel Barry .ultures. 2. UTI: Has positive UA, urine cultures are pending, received Rocephin in ED, will continue another 5 days of Vantin as an outpatient 3. Hypertension on amlodipine. 4. Hypothyroidism on Synthroid. 5. Dementia on Namenda.  Discharge Condition:  stable at time of discharge Discussed with both son and daughter    Discharge Instructions  and  Discharge Medications    Discharge Instructions    Discharge  instructions   Complete by:  As directed    Follow with Primary MD in 3 days   Get CBC, CMP, checked  by Primary MD next visit.    Activity: As tolerated with Full fall precautions use walker/cane & assistance as needed, increased risk for fall   Disposition ALF  Diet: Heart Healthy  , with feeding assistance and aspiration precautions.  For Heart failure patients - Check your Weight same time everyday, if you gain over 2 pounds, or you develop in leg swelling, experience more shortness of breath or chest pain, call your Primary MD immediately. Follow Cardiac Low Salt Diet and 1.5 lit/day fluid restriction.   On your next visit with your primary care physician please Get Medicines reviewed and adjusted.   Please request your Prim.MD to go over all Hospital Tests and Procedure/Radiological results at the follow up, please get all Hospital records sent to your Prim MD by signing hospital release before you go home.   If you experience worsening of your admission symptoms, develop shortness of breath, life threatening emergency, suicidal or homicidal thoughts you must seek medical attention immediately by calling 911 or calling your MD immediately  if symptoms less severe.  You Must read complete instructions/literature along with all the possible adverse reactions/side effects for all the Medicines you take and that have been prescribed to you. Take any new Medicines after you have completely understood and accpet all the possible adverse reactions/side effects.   Do not drive, operating heavy machinery, perform activities at heights, swimming or participation in water activities or provide baby sitting services if your were admitted for syncope or siezures until you have seen by Primary MD or a Neurologist and advised to do so again.  Do not drive when taking Pain medications.    Do not take more than prescribed Pain, Sleep and Anxiety Medications  Special Instructions: If you have  smoked or chewed Tobacco  in the last 2 yrs please stop smoking, stop any regular Alcohol  and or any Recreational drug use.  Wear Seat belts while driving.   Please note  You were cared for by a hospitalist during your hospital stay. If you have any questions about your discharge medications or the care you received while you were in the hospital after you are discharged, you can call the unit and asked to speak with the hospitalist on call if the hospitalist that took care of you is not available. Once you are discharged, your primary care physician will handle any further medical issues. Please note that NO REFILLS for any discharge medications will be authorized once you are discharged, as it is  imperative that you return to your primary care physician (or establish a relationship with a primary care physician if you do not have one) for your aftercare needs so that they can reassess your need for medications and monitor your lab values.     Allergies as of 09/20/2018      Reactions   Ciprofloxacin Hcl Other (See Comments)   Possibly cause shoulder pain per daughter      Medication List    STOP taking these medications   oxyCODONE-acetaminophen 5-325 MG tablet Commonly known as:  Percocet     TAKE these medications   acetaminophen 325 MG tablet Commonly known as:  TYLENOL Take 325 mg by mouth 3 (three) times daily.   acetaminophen 500 MG tablet Commonly known as:  TYLENOL Take 500 mg by mouth daily as needed for moderate pain.   amLODipine 5 MG tablet Commonly known as:  NORVASC Take 5 mg by mouth daily.   busPIRone 10 MG tablet Commonly known as:  BUSPAR Take 10 mg by mouth 2 (two) times daily with a meal.   Calcium Citrate-Vitamin D 315-250 MG-UNIT Tabs Take 1 tablet by mouth daily.   cefpodoxime 100 MG tablet Commonly known as:  VANTIN Take 1 tablet (100 mg total) by mouth 2 (two) times daily.   Cosopt PF 22.3-6.8 MG/ML Soln ophthalmic solution Generic drug:   dorzolamidel-timolol Place 1 drop into both eyes 2 (two) times daily.   docusate sodium 100 MG capsule Commonly known as:  COLACE Take 100 mg by mouth daily.   enoxaparin 40 MG/0.4ML injection Commonly known as:  LOVENOX Inject 0.4 mLs (40 mg total) into the skin daily.   ferrous sulfate 325 (65 FE) MG tablet Take 1 tablet (325 mg total) by mouth 2 (two) times daily with a meal for 30 days.   FLUoxetine 20 MG capsule Commonly known as:  PROZAC Take 20 mg by mouth daily.   guaifenesin 100 MG/5ML syrup Commonly known as:  ROBITUSSIN Take 200 mg by mouth 4 (four) times daily as needed for cough.   HYDROcodone-acetaminophen 5-325 MG tablet Commonly known as:  NORCO/VICODIN Take 1 tablet by mouth every 8 (eight) hours.   levETIRAcetam 500 MG tablet Commonly known as:  KEPPRA Take 500 mg by mouth 2 (two) times daily.   levothyroxine 50 MCG tablet Commonly known as:  SYNTHROID, LEVOTHROID Take 50 mcg by mouth every evening.   Lidocaine 4 % Ptch Apply 1 patch topically daily. Apply to left lower lateral rib cage   LORazepam 0.5 MG tablet Commonly known as:  ATIVAN Take 0.5 mg by mouth every 6 (six) hours as needed for anxiety.   methocarbamol 500 MG tablet Commonly known as:  ROBAXIN Take 1 tablet (500 mg total) by mouth every 6 (six) hours as needed for up to 20 doses for muscle spasms.   multivitamin with minerals Tabs tablet Take 1 tablet by mouth daily.   Namenda XR 28 MG Cp24 24 hr capsule Generic drug:  memantine Take 28 mg by mouth daily.   NUTRITIONAL SUPPLEMENTS PO Take 120 mLs by mouth 2 (two) times daily. MedPass 2.0   feeding supplement (ENSURE ENLIVE) Liqd Take 237 mLs by mouth 3 (three) times daily with meals.   oxyCODONE 5 MG immediate release tablet Commonly known as:  Oxy IR/ROXICODONE Take 5 mg by mouth every 12 (twelve) hours as needed for severe pain.   polyethylene glycol packet Commonly known as:  MIRALAX / GLYCOLAX Take 17 g by mouth  daily.   senna-docusate 8.6-50 MG tablet Commonly known as:  Senokot-S Take 1 tablet by mouth daily.   triamcinolone cream 0.1 % Commonly known as:  KENALOG Apply 1 application topically daily as needed (rash/irritation).   UTI-Stat Liqd Take 30 mLs by mouth daily.         Diet and Activity recommendation: See Discharge Instructions above   Consults obtained -  none   Major procedures and Radiology Reports - PLEASE review detailed and final reports for all details, in brief -      Ct Head Wo Contrast  Result Date: 09/20/2018 CLINICAL DATA:  Larey Seat, hit LEFT forehead. Unresponsive. History of hypertension and dementia. EXAM: CT HEAD WITHOUT CONTRAST CT MAXILLOFACIAL WITHOUT CONTRAST CT CERVICAL SPINE WITHOUT CONTRAST TECHNIQUE: Multidetector CT imaging of the head, cervical spine, and maxillofacial structures were performed using the standard protocol without intravenous contrast. Multiplanar CT image reconstructions of the cervical spine and maxillofacial structures were also generated. COMPARISON:  CT HEAD and cervical spine July 17, 2018 FINDINGS: CT HEAD FINDINGS BRAIN: No intraparenchymal hemorrhage, mass effect nor midline shift. No acute large vascular territory infarcts. Confluent supratentorial white matter hypodensities unchanged. Old basal ganglia lacunar infarcts. No abnormal extra-axial fluid collections. Basal cisterns are patent. VASCULAR: Moderate to severe calcific atherosclerosis carotid siphon and included vertebral arteries. SKULL/SOFT TISSUES: No skull fracture. Moderate LEFT frontal scalp hematoma without subcutaneous gas or radiopaque foreign bodies. OTHER: None. CT MAXILLOFACIAL FINDINGS OSSEOUS: No acute facial fracture. The mandible is intact, the condyles are located. No destructive bony lesions. Severe temporomandibular osteoarthrosis. Multiple absent teeth. ORBITS: Ocular globes and orbital contents are nonacute. Status post bilateral ocular lens implants.  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 Status post FESS. Mild fronto ethmoidal mucosal thickening. Mastoid air cells are well aerated. SOFT TISSUES: No significant soft tissue swelling. No subcutaneous gas or radiopaque foreign bodies. CT CERVICAL SPINE FINDINGS ALIGNMENT: Maintenance of cervical lordosis. Minimal grade 1 C 5 6 anterolisthesis, unchanged. SKULL BASE AND VERTEBRAE: Cervical vertebral bodies intact. Stable severe C6-7 degenerative disc, moderate C3-4 and C5-6. Osteopenia without destructive bony lesions. C1-2 articulation maintained with moderate arthrosis. SOFT TISSUES AND SPINAL CANAL: Nonacute. Moderate calcific atherosclerosis carotid bifurcations. DISC LEVELS: No high-grade osseous canal stenosis or neural foraminal narrowing. UPPER CHEST: Biapical pleuroparenchymal scarring with RIGHT apical calcified pleural plaque. OTHER: None. IMPRESSION: CT HEAD: 1. No acute intracranial process. Moderate LEFT frontal scalp hematoma. 2. Stable examination including severe chronic small vessel ischemic changes and old lacunar infarcts. CT MAXILLOFACIAL: 1. No facial fracture. CT CERVICAL SPINE: 1. No fracture. Stable grade 1 C5-6 anterolisthesis on a degenerative basis. Electronically Signed   By: Awilda Metro M.D.   On: 09/20/2018 01:00   Ct Cervical Spine Wo Contrast  Result Date: 09/20/2018 CLINICAL DATA:  Larey Seat, hit LEFT forehead. Unresponsive. History of hypertension and dementia. EXAM: CT HEAD WITHOUT CONTRAST CT MAXILLOFACIAL WITHOUT CONTRAST CT CERVICAL SPINE WITHOUT CONTRAST TECHNIQUE: Multidetector CT imaging of the head, cervical spine, and maxillofacial structures were performed using the standard protocol without intravenous contrast. Multiplanar CT image reconstructions of the cervical spine and maxillofacial structures were also generated. COMPARISON:  CT HEAD and cervical spine July 17, 2018 FINDINGS: CT HEAD FINDINGS BRAIN: No intraparenchymal  hemorrhage, mass effect nor midline shift. No acute large vascular territory infarcts. Confluent supratentorial white matter hypodensities unchanged. Old basal ganglia lacunar infarcts. No abnormal extra-axial fluid collections. Basal cisterns are patent. VASCULAR: Moderate to severe calcific atherosclerosis carotid siphon and included vertebral arteries.  SKULL/SOFT TISSUES: No skull fracture. Moderate LEFT frontal scalp hematoma without subcutaneous gas or radiopaque foreign bodies. OTHER: None. CT MAXILLOFACIAL FINDINGS OSSEOUS: No acute facial fracture. The mandible is intact, the condyles are located. No destructive bony lesions. Severe temporomandibular osteoarthrosis. Multiple absent teeth. ORBITS: Ocular globes and orbital contents are nonacute. Status post bilateral ocular lens implants. 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 Status post FESS. Mild fronto ethmoidal mucosal thickening. Mastoid air cells are well aerated. SOFT TISSUES: No significant soft tissue swelling. No subcutaneous gas or radiopaque foreign bodies. CT CERVICAL SPINE FINDINGS ALIGNMENT: Maintenance of cervical lordosis. Minimal grade 1 C 5 6 anterolisthesis, unchanged. SKULL BASE AND VERTEBRAE: Cervical vertebral bodies intact. Stable severe C6-7 degenerative disc, moderate C3-4 and C5-6. Osteopenia without destructive bony lesions. C1-2 articulation maintained with moderate arthrosis. SOFT TISSUES AND SPINAL CANAL: Nonacute. Moderate calcific atherosclerosis carotid bifurcations. DISC LEVELS: No high-grade osseous canal stenosis or neural foraminal narrowing. UPPER CHEST: Biapical pleuroparenchymal scarring with RIGHT apical calcified pleural plaque. OTHER: None. IMPRESSION: CT HEAD: 1. No acute intracranial process. Moderate LEFT frontal scalp hematoma. 2. Stable examination including severe chronic small vessel ischemic changes and old lacunar infarcts. CT MAXILLOFACIAL: 1. No facial fracture. CT  CERVICAL SPINE: 1. No fracture. Stable grade 1 C5-6 anterolisthesis on a degenerative basis. Electronically Signed   By: Awilda Metroourtnay  Bloomer M.D.   On: 09/20/2018 01:00   Dg Pelvis Portable  Result Date: 09/20/2018 CLINICAL DATA:  Larey SeatFell at assisted living facility. EXAM: PORTABLE PELVIS 1-2 VIEWS COMPARISON:  RIGHT hip radiograph August 09, 2018 FINDINGS: No acute fracture deformity. Status post RIGHT femur ORIF with residual fracture line. Imaged hardware is intact. Old RIGHT superior and. Pubic rami fractures. Osteopenia without destructive bony lesions. Advanced aortoiliac calcifications. IMPRESSION: 1. No acute fracture deformity or dislocation. Osteopenia decreases sensitivity for acute nondisplaced fractures. 2. Status post RIGHT femur ORIF with residual fracture line. 3.  Aortic Atherosclerosis (ICD10-I70.0). Electronically Signed   By: Awilda Metroourtnay  Bloomer M.D.   On: 09/20/2018 00:27   Dg Chest Portable 1 View  Result Date: 09/20/2018 CLINICAL DATA:  One 73hundred year old female with fall. EXAM: PORTABLE CHEST 1 VIEW COMPARISON:  Chest radiograph dated 07/17/2018 FINDINGS: There is no focal consolidation, pleural effusion, or pneumothorax. Mild diffuse interstitial coarsening. Stable retrocardiac density, possibly a hiatal hernia. Stable cardiac silhouette. Atherosclerotic calcification of the aorta. Osteopenia. No acute osseous pathology. Probable old healed left humeral neck fracture. IMPRESSION: 1. No acute cardiopulmonary process. 2. No acute rib fracture or bone destruction. Electronically Signed   By: Elgie CollardArash  Radparvar M.D.   On: 09/20/2018 00:24   Ct Maxillofacial Wo Contrast  Result Date: 09/20/2018 CLINICAL DATA:  Larey SeatFell, hit LEFT forehead. Unresponsive. History of hypertension and dementia. EXAM: CT HEAD WITHOUT CONTRAST CT MAXILLOFACIAL WITHOUT CONTRAST CT CERVICAL SPINE WITHOUT CONTRAST TECHNIQUE: Multidetector CT imaging of the head, cervical spine, and maxillofacial structures were  performed using the standard protocol without intravenous contrast. Multiplanar CT image reconstructions of the cervical spine and maxillofacial structures were also generated. COMPARISON:  CT HEAD and cervical spine July 17, 2018 FINDINGS: CT HEAD FINDINGS BRAIN: No intraparenchymal hemorrhage, mass effect nor midline shift. No acute large vascular territory infarcts. Confluent supratentorial white matter hypodensities unchanged. Old basal ganglia lacunar infarcts. No abnormal extra-axial fluid collections. Basal cisterns are patent. VASCULAR: Moderate to severe calcific atherosclerosis carotid siphon and included vertebral arteries. SKULL/SOFT TISSUES: No skull fracture. Moderate LEFT frontal scalp hematoma without subcutaneous gas or radiopaque foreign bodies. OTHER: None.  CT MAXILLOFACIAL FINDINGS OSSEOUS: No acute facial fracture. The mandible is intact, the condyles are located. No destructive bony lesions. Severe temporomandibular osteoarthrosis. Multiple absent teeth. ORBITS: Ocular globes and orbital contents are nonacute. Status post bilateral ocular lens implants. 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 Status post FESS. Mild fronto ethmoidal mucosal thickening. Mastoid air cells are well aerated. SOFT TISSUES: No significant soft tissue swelling. No subcutaneous gas or radiopaque foreign bodies. CT CERVICAL SPINE FINDINGS ALIGNMENT: Maintenance of cervical lordosis. Minimal grade 1 C 5 6 anterolisthesis, unchanged. SKULL BASE AND VERTEBRAE: Cervical vertebral bodies intact. Stable severe C6-7 degenerative disc, moderate C3-4 and C5-6. Osteopenia without destructive bony lesions. C1-2 articulation maintained with moderate arthrosis. SOFT TISSUES AND SPINAL CANAL: Nonacute. Moderate calcific atherosclerosis carotid bifurcations. DISC LEVELS: No high-grade osseous canal stenosis or neural foraminal narrowing. UPPER CHEST: Biapical pleuroparenchymal scarring with RIGHT  apical calcified pleural plaque. OTHER: None. IMPRESSION: CT HEAD: 1. No acute intracranial process. Moderate LEFT frontal scalp hematoma. 2. Stable examination including severe chronic small vessel ischemic changes and old lacunar infarcts. CT MAXILLOFACIAL: 1. No facial fracture. CT CERVICAL SPINE: 1. No fracture. Stable grade 1 C5-6 anterolisthesis on a degenerative basis. Electronically Signed   By: Awilda Metro M.D.   On: 09/20/2018 01:00    Micro Results    No results found for this or any previous visit (from the past 240 hour(s)).     Today   Subjective:   Rachel Barry today confused, demented, no significant events per staff .   Objective:   Blood pressure (!) 145/74, pulse 81, temperature 98 F (36.7 C), temperature source Axillary, resp. rate 18, weight 36.3 kg, SpO2 94 %.   Intake/Output Summary (Last 24 hours) at 09/20/2018 1226 Last data filed at 09/20/2018 0948 Gross per 24 hour  Intake 120 ml  Output --  Net 120 ml    Exam Awake, demented, , difficult left forehead/periorbital bruising ,extremely frail.  Symmetrical Chest wall movement, Good air movement bilaterally, CTAB RRR,No Gallops,Rubs or new Murmurs, No Parasternal Heave +ve B.Sounds, Abd Soft, Non tender, No rebound -guarding or rigidity. No Cyanosis, Clubbing or edema, No new Rash or bruise  Data Review   CBC w Diff:  Lab Results  Component Value Date   WBC 6.9 09/20/2018   HGB 13.0 09/20/2018   HCT 41.4 09/20/2018   PLT 198 09/20/2018   LYMPHOPCT 12 09/20/2018   MONOPCT 5 09/20/2018   EOSPCT 5 09/20/2018   BASOPCT 1 09/20/2018    CMP:  Lab Results  Component Value Date   NA 141 09/20/2018   K 3.5 09/20/2018   CL 105 09/20/2018   CO2 27 09/20/2018   BUN 15 09/20/2018   CREATININE 0.55 09/20/2018   PROT 6.4 (L) 09/20/2018   ALBUMIN 3.8 09/20/2018   BILITOT 0.4 09/20/2018   ALKPHOS 98 09/20/2018   AST 66 (H) 09/20/2018   ALT 52 (H) 09/20/2018  .   Total Time in  preparing paper work, data evaluation and todays exam - 30 minutes  Huey Bienenstock M.D on 09/20/2018 at 12:26 PM  [facility]   Office  [phone]

## 2018-09-20 NOTE — TOC Transition Note (Signed)
Transition of Care [facility]) - CM/SW Discharge Note   Patient Details  Name: Rachel Barry MRN: [id number] Date of Birth: [birth year]  [facility] CM/SW Contact:  Gildardo Griffes, LCSW Phone Number: 09/20/2018, 12:37 PM   Clinical Narrative:     Patient will DC to:[facility] Anticipated DC date: 09/20/2018 Family notified: Bonita Quin (daughter) Transport [id number]  Per MD patient ready for DC to [facility] . RN, patient, patient's family, and facility notified of DC. Discharge Summary sent to facility. RN given number for report [phone]. DC packet on chart. Ambulance transport requested for patient.  CSW signing off.  [place], [place] [phone]   Final next level of care: Skilled Nursing Facility Barriers to Discharge: No Barriers Identified   Patient Goals and CMS Choice Patient states their goals for this hospitalization and ongoing recovery are:: to go back to [facility].gov Compare Post Acute Care list provided to:: Patient Represenative (must comment)(daughter Bonita Quin) Choice offered to / list presented to : Adult Children  Discharge Placement PASRR number recieved: ([facility] reports no need for duplicate FL2 or PASRR they have her information)            Patient chooses bed at: [facility] Patient to be transferred to facility by: [facility] Name of family member notified: Bonita Quin Patient and family notified of of transfer: 09/20/18  Discharge Plan and Services   Discharge Planning Services: NA Post Acute Care Choice: Skilled Nursing Facility          DME Arranged: N/A DME Agency: NA HH Arranged: NA HH Agency: NA   Social Determinants of Health (SDOH) Interventions     Readmission Risk Interventions No flowsheet data found.

## 2018-09-20 NOTE — ED Notes (Signed)
ED TO INPATIENT HANDOFF REPORT  ED Nurse Name and Phone #: Dulcy Fanny [phone]  S Name/Age/Gender Rachel Barry 85 y.o. female Room/Bed: TRACC/TRACC  Code Status   Code Status: Prior  Home/SNF/Other Skilled nursing facility Patient oriented to: self Is this baseline? Yes   Triage Complete: Triage complete  Chief Complaint fall; ams  Triage Note Patient from an assisted living dementia facility, fell and hit head on left forehead.  She is now unresponsive, deviated gaze to right.  She is usually able to answer a few questions appropriately.  Upon arrival she is moaning with painful stimuli.  Patient does not follow commands, does not open eyes.     Allergies Allergies  Allergen Reactions  . Ciprofloxacin Hcl Other (See Comments)    Possibly cause shoulder pain per daughter    Level of Care/Admitting Diagnosis ED Disposition    ED Disposition Condition Comment   Admit  Hospital Area: [facility] [[id number]]  Level of Care: Telemetry Medical [104]  I expect the patient will be discharged within 24 hours: No (not a candidate for 5C-Observation unit)  Diagnosis: Unresponsive episode [[id number]]  Admitting Physician: Eduard Clos [phone]  Attending Physician: Eduard Clos [[id number]  PT Class (Do Not Modify): Observation [104]  PT Acc Code (Do Not Modify): Observation [10022]       B Medical/Surgery History Past Medical History:  Diagnosis Date  . Anxiety   . Arthritis   . Dementia (HCC)   . Glaucoma   . Hypertension   . Seizures (HCC)   . Thyroid disease    Past Surgical History:  Procedure Laterality Date  . HERNIA REPAIR    . INTRAMEDULLARY (IM) NAIL INTERTROCHANTERIC Right 06/30/2018   Procedure: INTRAMEDULLARY (IM) NAIL RIGHT INTERTROCHANTRIC;  Surgeon: Tarry Kos, MD;  Location: [facility] OR;  Service: Orthopedics;  Laterality: Right;  . NASAL SINUS SURGERY       A IV Location/Drains/Wounds Patient Lines/Drains/Airways Status    Active Line/Drains/Airways    Name:   Placement date:   Placement time:   Site:   Days:   Peripheral IV 09/20/18 Right Wrist   09/20/18    0015    Wrist   less than 1   Incision (Closed) 06/30/18 Thigh Right   06/30/18    1902     82          Intake/Output Last 24 hours No intake or output data in the 24 hours ending 09/20/18 0305  Labs/Imaging Results for orders placed or performed during the hospital encounter of 09/19/18 (from the past 48 hour(s))  CBC with Differential/Platelet     Status: Abnormal   Collection Time: 09/20/18 12:10 AM  Result Value Ref Range   WBC 8.7 4.0 - 10.5 K/uL   RBC 4.29 3.87 - 5.11 MIL/uL   Hemoglobin 13.7 12.0 - 15.0 g/dL   HCT 47.8 29.5 - 62.1 %   MCV 101.9 (H) 80.0 - 100.0 fL   MCH 31.9 26.0 - 34.0 pg   MCHC 31.4 30.0 - 36.0 g/dL   RDW 30.8 65.7 - 84.6 %   Platelets 188 150 - 400 K/uL   nRBC 0.0 0.0 - 0.2 %   Neutrophils Relative % 76 %   Neutro Abs 6.7 1.7 - 7.7 K/uL   Lymphocytes Relative 12 %   Lymphs Abs 1.0 0.7 - 4.0 K/uL   Monocytes Relative 5 %   Monocytes Absolute 0.5 0.1 - 1.0 K/uL   Eosinophils Relative 5 %  Eosinophils Absolute 0.4 0.0 - 0.5 K/uL   Basophils Relative 1 %   Basophils Absolute 0.0 0.0 - 0.1 K/uL   Immature Granulocytes 1 %   Abs Immature Granulocytes 0.04 0.00 - 0.07 K/uL    Comment: Performed at [facility], [address], [place], [place] [address]  Comprehensive metabolic panel     Status: Abnormal   Collection Time: 09/20/18 12:10 AM  Result Value Ref Range   Sodium 141 135 - 145 mmol/L   Potassium 3.9 3.5 - 5.1 mmol/L   Chloride 107 98 - 111 mmol/L   CO2 23 22 - 32 mmol/L   Glucose, Bld 95 70 - 99 mg/dL   BUN 19 8 - 23 mg/dL   Creatinine, Ser 2.11 0.44 - 1.00 mg/dL   Calcium 9.1 8.9 - 94.1 mg/dL   Total Protein 6.4 (L) 6.5 - 8.1 g/dL   Albumin 3.8 3.5 - 5.0 g/dL   AST 66 (H) 15 - 41 U/L   ALT 52 (H) 0 - 44 U/L   Alkaline Phosphatase 98 38 - 126 U/L   Total Bilirubin 0.4 0.3 - 1.2 mg/dL    GFR calc non Af Amer >60 >60 mL/min   GFR calc Af Amer >60 >60 mL/min   Anion gap 11 5 - 15    Comment: Performed at [facility], [address], [place], [place] [address]  Troponin I - ONCE - STAT     Status: None   Collection Time: 09/20/18 12:10 AM  Result Value Ref Range   Troponin I <0.03 <0.03 ng/mL    Comment: Performed at [facility], [address], [place], [place] [address]  Urinalysis, Routine w reflex microscopic     Status: Abnormal   Collection Time: 09/20/18 12:10 AM  Result Value Ref Range   Color, Urine YELLOW YELLOW   APPearance CLOUDY (A) CLEAR   Specific Gravity, Urine 1.017 1.005 - 1.030   pH 7.0 5.0 - 8.0   Glucose, UA NEGATIVE NEGATIVE mg/dL   Hgb urine dipstick SMALL (A) NEGATIVE   Bilirubin Urine NEGATIVE NEGATIVE   Ketones, ur NEGATIVE NEGATIVE mg/dL   Protein, ur 563 (A) NEGATIVE mg/dL   Nitrite NEGATIVE NEGATIVE   Leukocytes,Ua NEGATIVE NEGATIVE   RBC / HPF 0-5 0 - 5 RBC/hpf   WBC, UA >50 (H) 0 - 5 WBC/hpf   Bacteria, UA MANY (A) NONE SEEN   WBC Clumps PRESENT     Comment: Performed at [facility], [address], [place], [place] [address]   Ct Head Wo Contrast  Result Date: 09/20/2018 CLINICAL DATA:  Larey Seat, hit LEFT forehead. Unresponsive. History of hypertension and dementia. EXAM: CT HEAD WITHOUT CONTRAST CT MAXILLOFACIAL WITHOUT CONTRAST CT CERVICAL SPINE WITHOUT CONTRAST TECHNIQUE: Multidetector CT imaging of the head, cervical spine, and maxillofacial structures were performed using the standard protocol without intravenous contrast. Multiplanar CT image reconstructions of the cervical spine and maxillofacial structures were also generated. COMPARISON:  CT HEAD and cervical spine July 17, 2018 FINDINGS: CT HEAD FINDINGS BRAIN: No intraparenchymal hemorrhage, mass effect nor midline shift. No acute large vascular territory infarcts. Confluent supratentorial white matter hypodensities unchanged. Old basal ganglia lacunar  infarcts. No abnormal extra-axial fluid collections. Basal cisterns are patent. VASCULAR: Moderate to severe calcific atherosclerosis carotid siphon and included vertebral arteries. SKULL/SOFT TISSUES: No skull fracture. Moderate LEFT frontal scalp hematoma without subcutaneous gas or radiopaque foreign bodies. OTHER: None. CT MAXILLOFACIAL FINDINGS OSSEOUS: No acute facial  fracture. The mandible is intact, the condyles are located. No destructive bony lesions. Severe temporomandibular osteoarthrosis. Multiple absent teeth. ORBITS: Ocular globes and orbital contents are nonacute. Status post bilateral ocular lens implants. 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 Status post FESS. Mild fronto ethmoidal mucosal thickening. Mastoid air cells are well aerated. SOFT TISSUES: No significant soft tissue swelling. No subcutaneous gas or radiopaque foreign bodies. CT CERVICAL SPINE FINDINGS ALIGNMENT: Maintenance of cervical lordosis. Minimal grade 1 C 5 6 anterolisthesis, unchanged. SKULL BASE AND VERTEBRAE: Cervical vertebral bodies intact. Stable severe C6-7 degenerative disc, moderate C3-4 and C5-6. Osteopenia without destructive bony lesions. C1-2 articulation maintained with moderate arthrosis. SOFT TISSUES AND SPINAL CANAL: Nonacute. Moderate calcific atherosclerosis carotid bifurcations. DISC LEVELS: No high-grade osseous canal stenosis or neural foraminal narrowing. UPPER CHEST: Biapical pleuroparenchymal scarring with RIGHT apical calcified pleural plaque. OTHER: None. IMPRESSION: CT HEAD: 1. No acute intracranial process. Moderate LEFT frontal scalp hematoma. 2. Stable examination including severe chronic small vessel ischemic changes and old lacunar infarcts. CT MAXILLOFACIAL: 1. No facial fracture. CT CERVICAL SPINE: 1. No fracture. Stable grade 1 C5-6 anterolisthesis on a degenerative basis. Electronically Signed   By: Awilda Metro M.D.   On: 09/20/2018 01:00   Ct Cervical  Spine Wo Contrast  Result Date: 09/20/2018 CLINICAL DATA:  Larey Seat, hit LEFT forehead. Unresponsive. History of hypertension and dementia. EXAM: CT HEAD WITHOUT CONTRAST CT MAXILLOFACIAL WITHOUT CONTRAST CT CERVICAL SPINE WITHOUT CONTRAST TECHNIQUE: Multidetector CT imaging of the head, cervical spine, and maxillofacial structures were performed using the standard protocol without intravenous contrast. Multiplanar CT image reconstructions of the cervical spine and maxillofacial structures were also generated. COMPARISON:  CT HEAD and cervical spine July 17, 2018 FINDINGS: CT HEAD FINDINGS BRAIN: No intraparenchymal hemorrhage, mass effect nor midline shift. No acute large vascular territory infarcts. Confluent supratentorial white matter hypodensities unchanged. Old basal ganglia lacunar infarcts. No abnormal extra-axial fluid collections. Basal cisterns are patent. VASCULAR: Moderate to severe calcific atherosclerosis carotid siphon and included vertebral arteries. SKULL/SOFT TISSUES: No skull fracture. Moderate LEFT frontal scalp hematoma without subcutaneous gas or radiopaque foreign bodies. OTHER: None. CT MAXILLOFACIAL FINDINGS OSSEOUS: No acute facial fracture. The mandible is intact, the condyles are located. No destructive bony lesions. Severe temporomandibular osteoarthrosis. Multiple absent teeth. ORBITS: Ocular globes and orbital contents are nonacute. Status post bilateral ocular lens implants. 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 Status post FESS. Mild fronto ethmoidal mucosal thickening. Mastoid air cells are well aerated. SOFT TISSUES: No significant soft tissue swelling. No subcutaneous gas or radiopaque foreign bodies. CT CERVICAL SPINE FINDINGS ALIGNMENT: Maintenance of cervical lordosis. Minimal grade 1 C 5 6 anterolisthesis, unchanged. SKULL BASE AND VERTEBRAE: Cervical vertebral bodies intact. Stable severe C6-7 degenerative disc, moderate C3-4 and C5-6.  Osteopenia without destructive bony lesions. C1-2 articulation maintained with moderate arthrosis. SOFT TISSUES AND SPINAL CANAL: Nonacute. Moderate calcific atherosclerosis carotid bifurcations. DISC LEVELS: No high-grade osseous canal stenosis or neural foraminal narrowing. UPPER CHEST: Biapical pleuroparenchymal scarring with RIGHT apical calcified pleural plaque. OTHER: None. IMPRESSION: CT HEAD: 1. No acute intracranial process. Moderate LEFT frontal scalp hematoma. 2. Stable examination including severe chronic small vessel ischemic changes and old lacunar infarcts. CT MAXILLOFACIAL: 1. No facial fracture. CT CERVICAL SPINE: 1. No fracture. Stable grade 1 C5-6 anterolisthesis on a degenerative basis. Electronically Signed   By: Awilda Metro M.D.   On: 09/20/2018 01:00   Dg Pelvis Portable  Result Date: 09/20/2018 CLINICAL DATA:  Larey Seat at  assisted living facility. EXAM: PORTABLE PELVIS 1-2 VIEWS COMPARISON:  RIGHT hip radiograph August 09, 2018 FINDINGS: No acute fracture deformity. Status post RIGHT femur ORIF with residual fracture line. Imaged hardware is intact. Old RIGHT superior and. Pubic rami fractures. Osteopenia without destructive bony lesions. Advanced aortoiliac calcifications. IMPRESSION: 1. No acute fracture deformity or dislocation. Osteopenia decreases sensitivity for acute nondisplaced fractures. 2. Status post RIGHT femur ORIF with residual fracture line. 3.  Aortic Atherosclerosis (ICD10-I70.0). Electronically Signed   By: Awilda Metro M.D.   On: 09/20/2018 00:27   Dg Chest Portable 1 View  Result Date: 09/20/2018 CLINICAL DATA:  One 85 year old female with fall. EXAM: PORTABLE CHEST 1 VIEW COMPARISON:  Chest radiograph dated 07/17/2018 FINDINGS: There is no focal consolidation, pleural effusion, or pneumothorax. Mild diffuse interstitial coarsening. Stable retrocardiac density, possibly a hiatal hernia. Stable cardiac silhouette. Atherosclerotic calcification of the  aorta. Osteopenia. No acute osseous pathology. Probable old healed left humeral neck fracture. IMPRESSION: 1. No acute cardiopulmonary process. 2. No acute rib fracture or bone destruction. Electronically Signed   By: Elgie Collard M.D.   On: 09/20/2018 00:24   Ct Maxillofacial Wo Contrast  Result Date: 09/20/2018 CLINICAL DATA:  Larey Seat, hit LEFT forehead. Unresponsive. History of hypertension and dementia. EXAM: CT HEAD WITHOUT CONTRAST CT MAXILLOFACIAL WITHOUT CONTRAST CT CERVICAL SPINE WITHOUT CONTRAST TECHNIQUE: Multidetector CT imaging of the head, cervical spine, and maxillofacial structures were performed using the standard protocol without intravenous contrast. Multiplanar CT image reconstructions of the cervical spine and maxillofacial structures were also generated. COMPARISON:  CT HEAD and cervical spine July 17, 2018 FINDINGS: CT HEAD FINDINGS BRAIN: No intraparenchymal hemorrhage, mass effect nor midline shift. No acute large vascular territory infarcts. Confluent supratentorial white matter hypodensities unchanged. Old basal ganglia lacunar infarcts. No abnormal extra-axial fluid collections. Basal cisterns are patent. VASCULAR: Moderate to severe calcific atherosclerosis carotid siphon and included vertebral arteries. SKULL/SOFT TISSUES: No skull fracture. Moderate LEFT frontal scalp hematoma without subcutaneous gas or radiopaque foreign bodies. OTHER: None. CT MAXILLOFACIAL FINDINGS OSSEOUS: No acute facial fracture. The mandible is intact, the condyles are located. No destructive bony lesions. Severe temporomandibular osteoarthrosis. Multiple absent teeth. ORBITS: Ocular globes and orbital contents are nonacute. Status post bilateral ocular lens implants. 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 Status post FESS. Mild fronto ethmoidal mucosal thickening. Mastoid air cells are well aerated. SOFT TISSUES: No significant soft tissue swelling. No subcutaneous gas  or radiopaque foreign bodies. CT CERVICAL SPINE FINDINGS ALIGNMENT: Maintenance of cervical lordosis. Minimal grade 1 C 5 6 anterolisthesis, unchanged. SKULL BASE AND VERTEBRAE: Cervical vertebral bodies intact. Stable severe C6-7 degenerative disc, moderate C3-4 and C5-6. Osteopenia without destructive bony lesions. C1-2 articulation maintained with moderate arthrosis. SOFT TISSUES AND SPINAL CANAL: Nonacute. Moderate calcific atherosclerosis carotid bifurcations. DISC LEVELS: No high-grade osseous canal stenosis or neural foraminal narrowing. UPPER CHEST: Biapical pleuroparenchymal scarring with RIGHT apical calcified pleural plaque. OTHER: None. IMPRESSION: CT HEAD: 1. No acute intracranial process. Moderate LEFT frontal scalp hematoma. 2. Stable examination including severe chronic small vessel ischemic changes and old lacunar infarcts. CT MAXILLOFACIAL: 1. No facial fracture. CT CERVICAL SPINE: 1. No fracture. Stable grade 1 C5-6 anterolisthesis on a degenerative basis. Electronically Signed   By: Awilda Metro M.D.   On: 09/20/2018 01:00    Pending Labs Unresulted Labs (From admission, onward)    Start     Ordered   09/20/18 0125  Urine Culture  Once,   STAT  09/20/18 0124          Vitals/Pain Today's Vitals   09/20/18 0030 09/20/18 0115 09/20/18 0211 09/20/18 0230  BP: (!) 157/76 (!) 142/80 129/88 136/79  Pulse: 63 62 69 65  Resp: Temp:      TempSrc:      SpO2: 93% 93% 94% 93%  Weight:        Isolation Precautions No active isolations  Medications Medications  cefTRIAXone (ROCEPHIN) 1 g in sodium chloride 0.9 % 100 mL IVPB (0 g Intravenous Stopped 09/20/18 0202)  levETIRAcetam (KEPPRA) IVPB 1000 mg/100 mL premix (1,000 mg Intravenous New Bag/Given 09/20/18 0236)    Mobility walks with person assist High fall risk   Focused Assessments Neuro Assessment Handoff:  Swallow screen pass?          Neuro Assessment: Exceptions to WDL Neuro Checks:       Last Documented NIHSS Modified Score:   Has TPA been given? No If patient is a Neuro Trauma and patient is going to OR before floor call report to 4N Charge nurse: [phone] or [phone]     R Recommendations: See Admitting Provider Note  Report given to:   Additional Notes: Pt appears back to baseline, alert to self, responsive to voice, confused, speech clear.

## 2018-09-20 NOTE — Progress Notes (Signed)
[facility] reports being ready to have patient return when medically stable. They report no FL2 needed, to send dc summary when avail.   [place], [place] [phone]

## 2018-09-20 NOTE — ED Notes (Signed)
Paged trauma surg to Dr Manus Gunning

## 2018-09-22 LAB — URINE CULTURE: Culture: >=100000 — AB

## 2019-07-24 DEATH — deceased

## 2020-09-24 IMAGING — CT CT CERVICAL SPINE W/O CM
5 of 8 series · 13 of 33 positions shown, 14 images · non-contrast
Comparison: 06/30/2018

CLINICAL DATA: Unwitnessed fall from bed. Found on the floor.

EXAM:
CT HEAD WITHOUT CONTRAST
CT CERVICAL SPINE WITHOUT CONTRAST
TECHNIQUE: Multidetector CT imaging of the head and cervical spine was
performed following the standard protocol without intravenous
contrast. Multiplanar CT image reconstructions of the cervical spine
were also generated.

[Series 4: head bone · axial · 0.47mm/px · z∈[-40,+10]mm · 2 of 75 slices shown]
[im 25/75  bone]
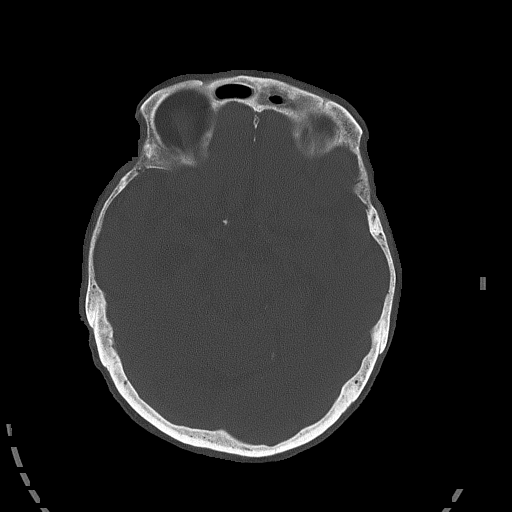
[im 50/75  bone]
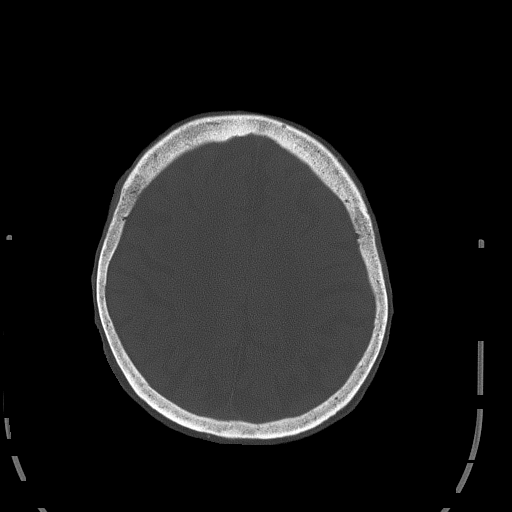

[Series 5: head without cor · coronal · non-contrast · 0.29mm/px · 3 of 69 slices shown]
[im 18/69  bone]
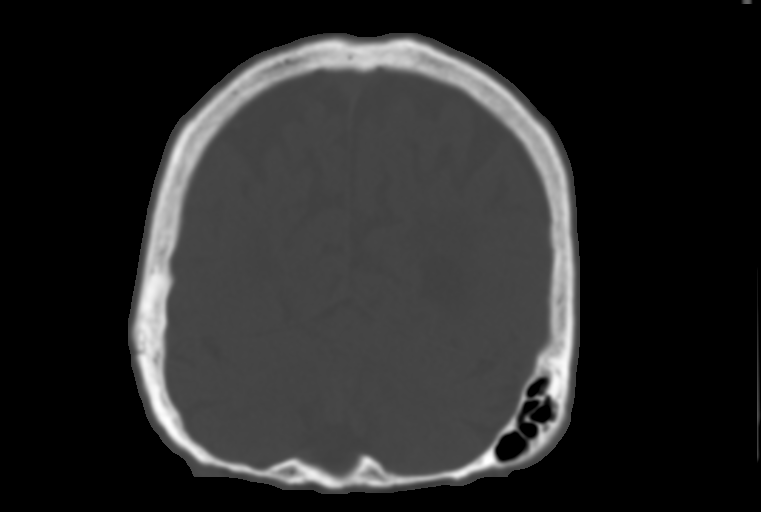
[im 35/69  bone]
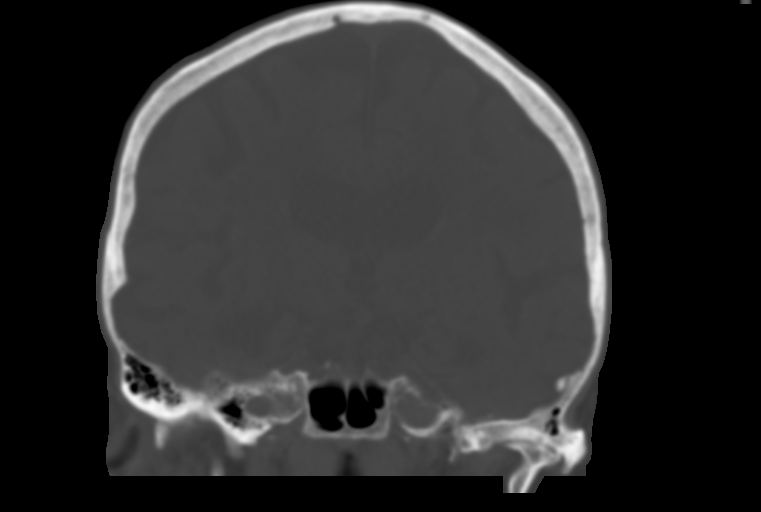
[im 52/69  bone]
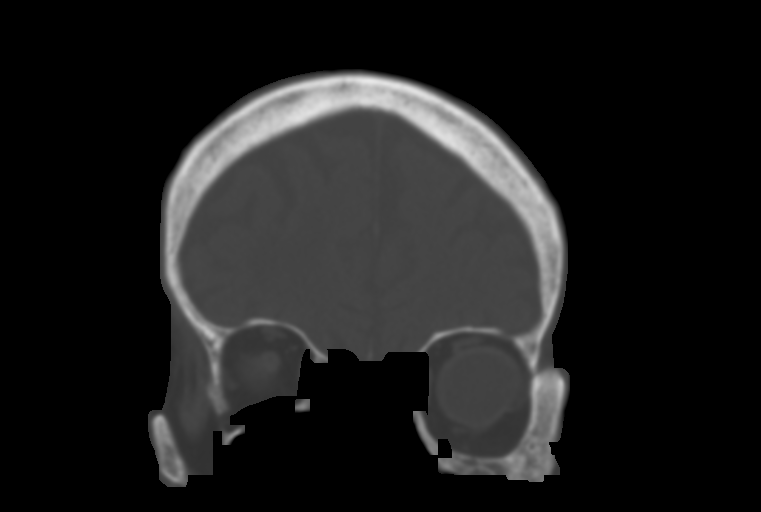

[Series 7: c_spine 2.0 st · axial · 0.34mm/px · z∈[-130,-86]mm · 2 of 67 slices shown, 3 images]
[im 23/67  soft-tissue]
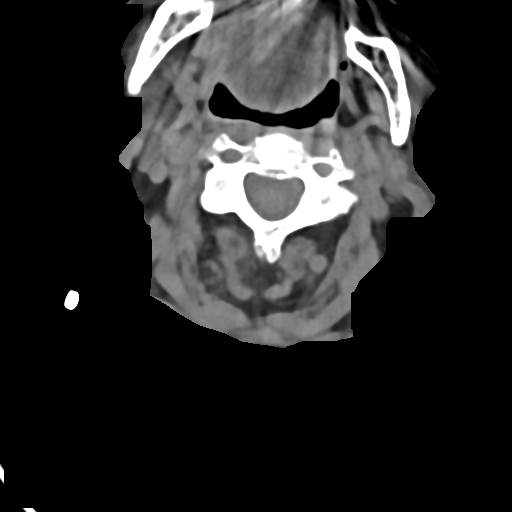
[im 23/67  bone]
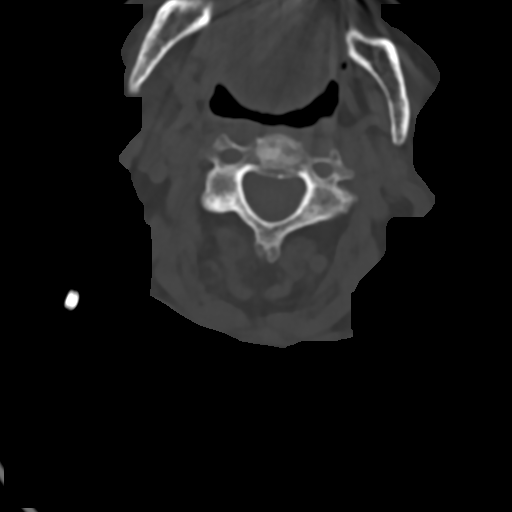
[im 45/67  bone]
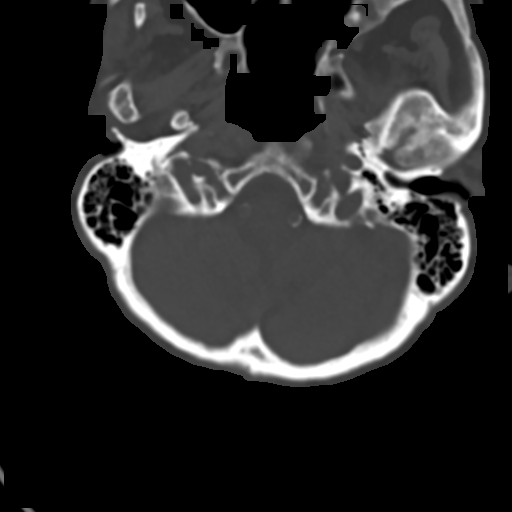

[Series 10: c_spine 2.0 sag bone · sagittal · 0.28mm/px · 4 of 61 slices shown]
[im 13/61  bone]
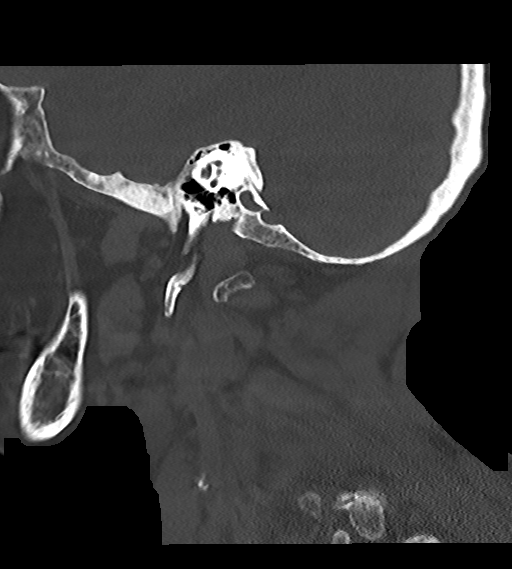
[im 25/61  bone]
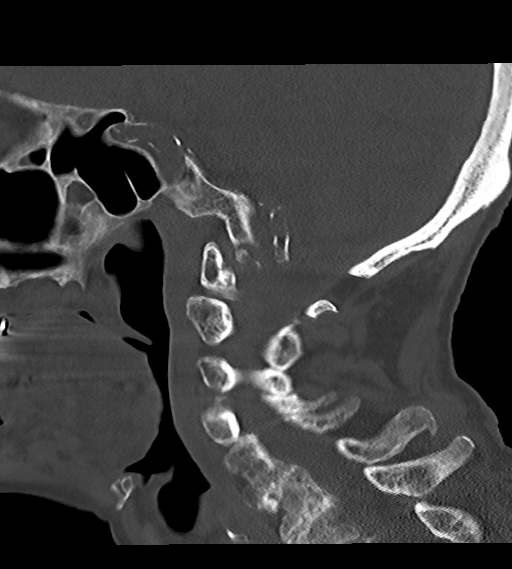
[im 37/61  bone]
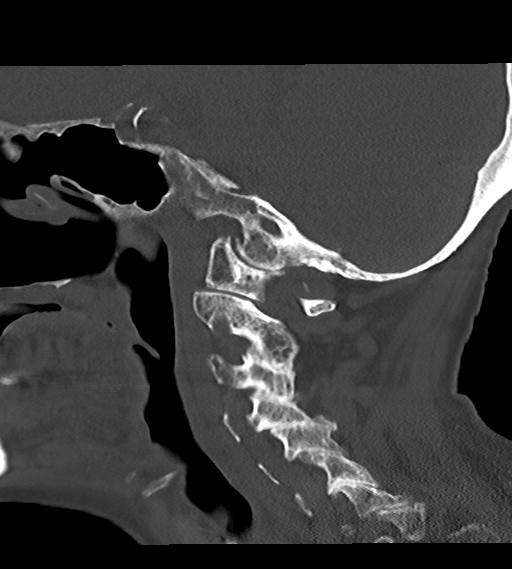
[im 49/61  bone]
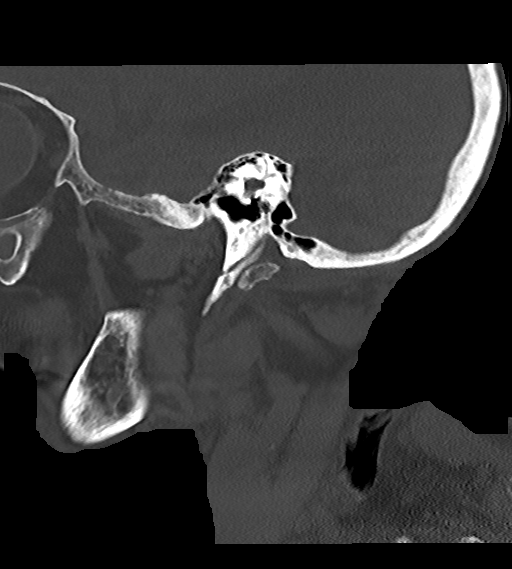

[Series 12: c_spine 2.0 orthogonals · axial · 0.21mm/px · z∈[-177,-142]mm · 2 of 76 slices shown]
[im 26/76  bone]
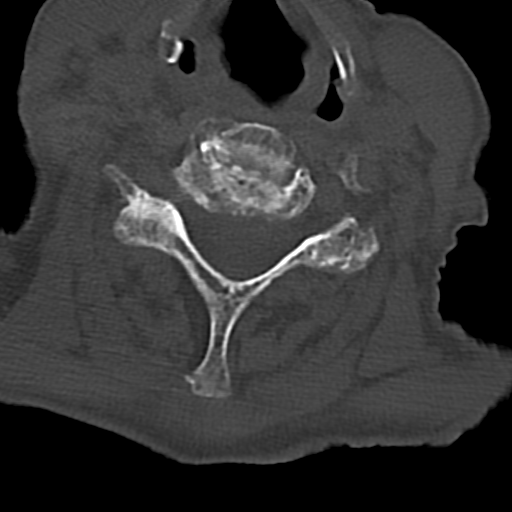
[im 51/76  bone]
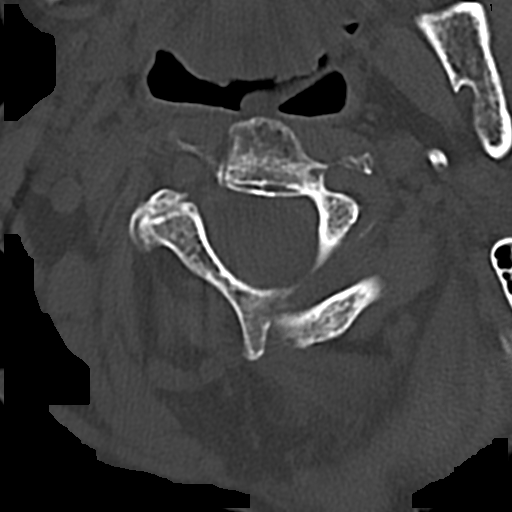

[13 of 33 positions shown; findings below may reference images not displayed]

FINDINGS: CT HEAD FINDINGS

Brain: Generalized atrophy. Extensive chronic small-vessel ischemic
changes affecting the white matter, basal ganglia, thalami and pons.
No evidence of acute infarction, mass lesion, hemorrhage,
hydrocephalus or extra-axial collection.

Vascular: There is atherosclerotic calcification of the major
vessels at the base of the brain.

Skull: Negative

Sinuses/Orbits: Clear except for chronic inflammatory changes of the
left maxillary sinus. Previous functional endoscopic sinus surgery.
Orbits negative.

Other: None

CT CERVICAL SPINE FINDINGS

Alignment: No traumatic malalignment.

Skull base and vertebrae: No sign of fracture. The scan does not go
all the way through C7 and includes only a small portion of T1.

Soft tissues and spinal canal: Negative

Disc levels: Chronic degenerative spondylosis and facet arthropathy
without apparent significant spinal stenosis.

Upper chest: Not included.

Other: None
IMPRESSION: 1. Head CT: No acute or traumatic finding. Atrophy and extensive
chronic small-vessel ischemic changes.
2. Cervical spine CT: No acute or traumatic finding. Chronic
degenerative spondylosis and facet arthropathy.

## 2020-11-28 IMAGING — CT CT MAXILLOFACIAL WITHOUT CONTRAST
4 of 13 series · 14 of 47 positions shown, 16 images · non-contrast
Comparison: CT HEAD and cervical spine July 17, 2018

CLINICAL DATA: Fell, hit LEFT forehead. Unresponsive. History of
hypertension and dementia.

EXAM:
CT HEAD WITHOUT CONTRAST
CT MAXILLOFACIAL WITHOUT CONTRAST
CT CERVICAL SPINE WITHOUT CONTRAST
TECHNIQUE: Multidetector CT imaging of the head, cervical spine, and
maxillofacial structures were performed using the standard protocol
without intravenous contrast. Multiplanar CT image reconstructions
of the cervical spine and maxillofacial structures were also
generated.

[Series 4: maxilllofacial 2.0 hr40 3 · axial · 0.36mm/px · z∈[-178,-88]mm · 4 of 76 slices shown]
[im 16/76  bone]
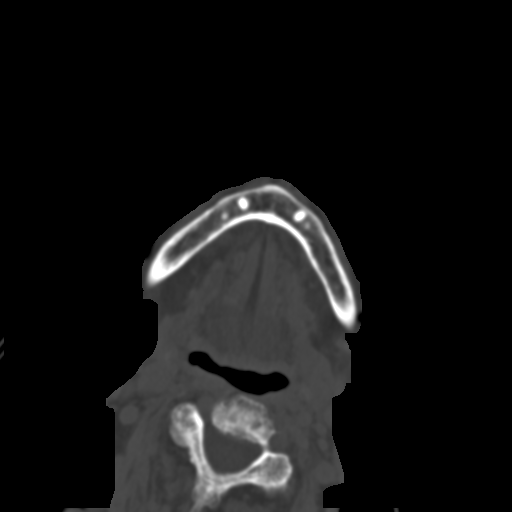
[im 31/76  bone]
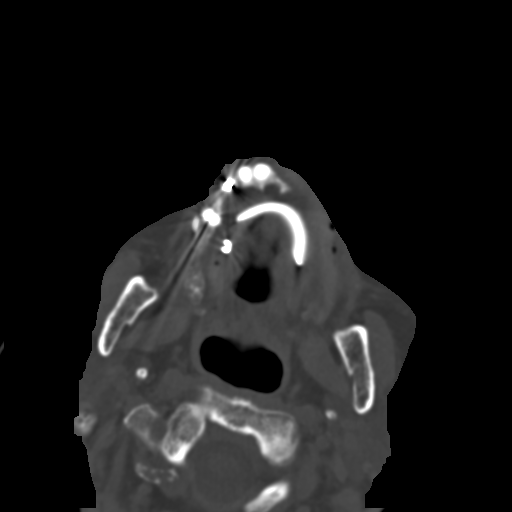
[im 46/76  bone]
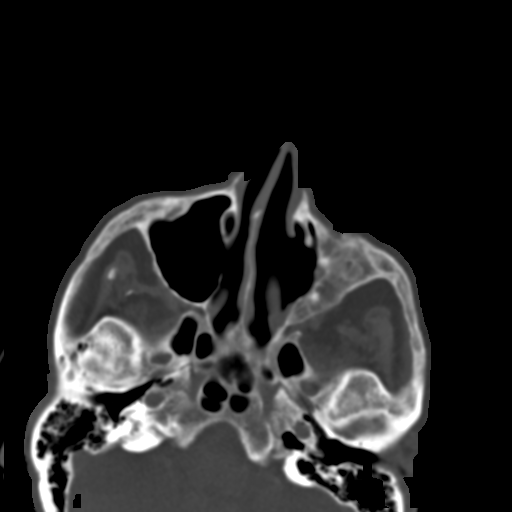
[im 61/76  bone]
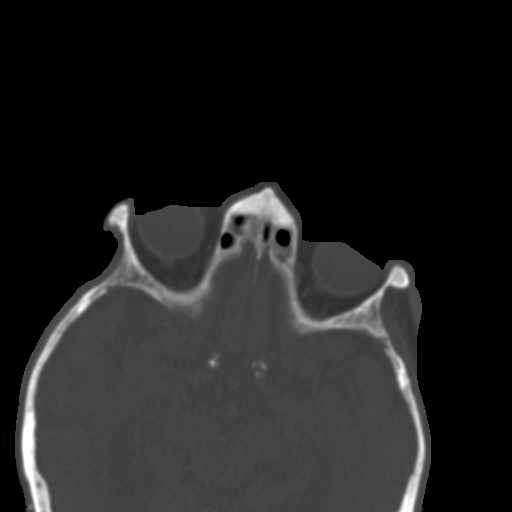

[Series 7: head bone · axial · 0.48mm/px · z∈[-134,-22]mm · 5 of 85 slices shown, 7 images]
[im 15/85  brain]
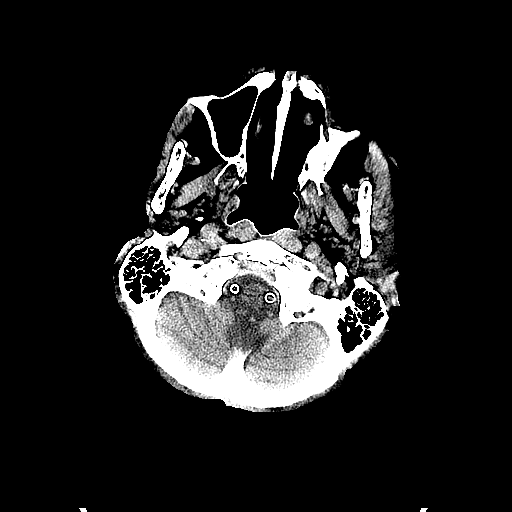
[im 15/85  bone]
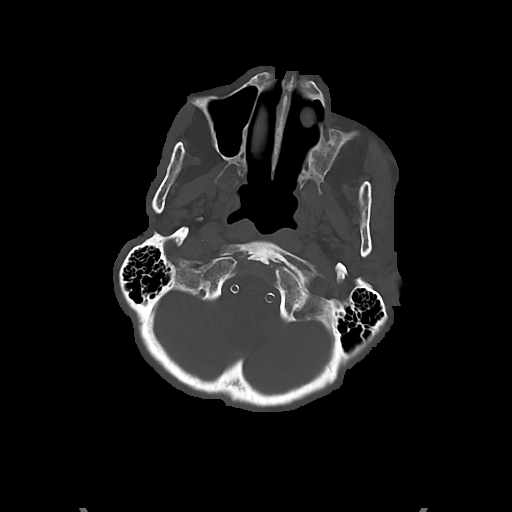
[im 29/85  bone]
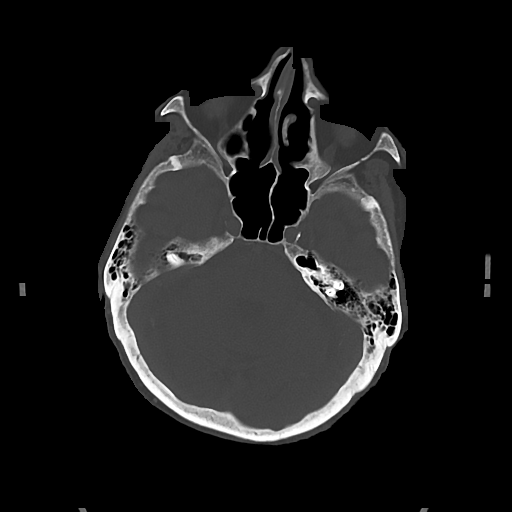
[im 43/85  bone]
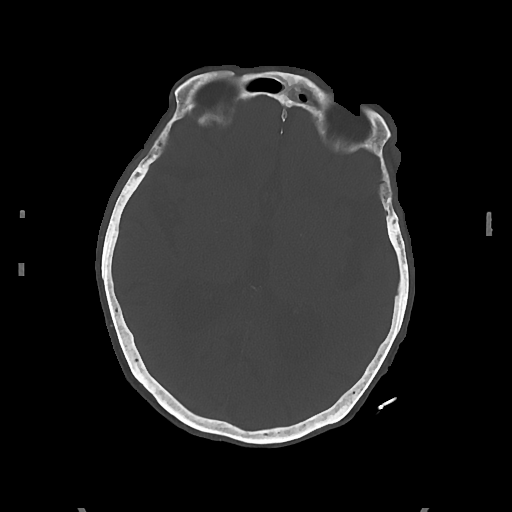
[im 57/85  bone]
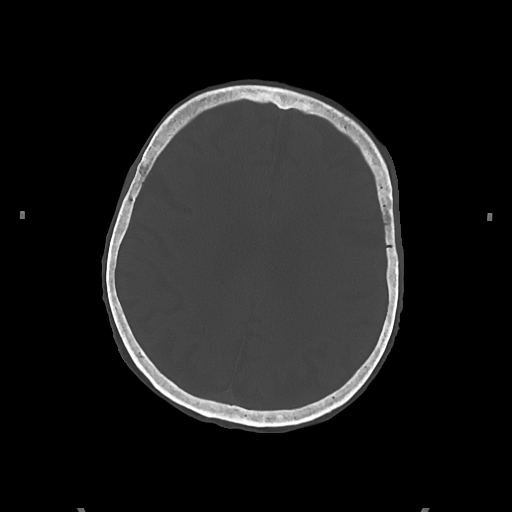
[im 71/85  brain]
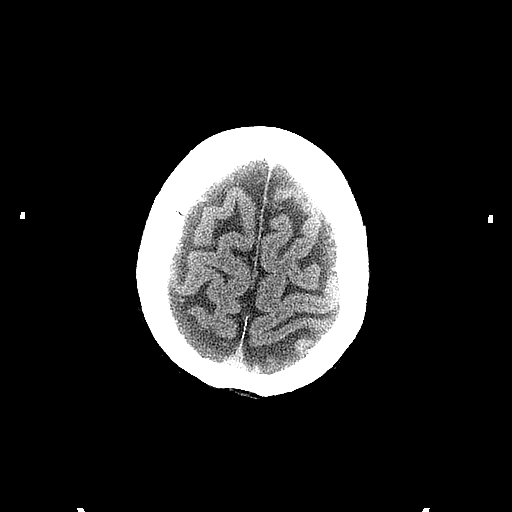
[im 71/85  bone]
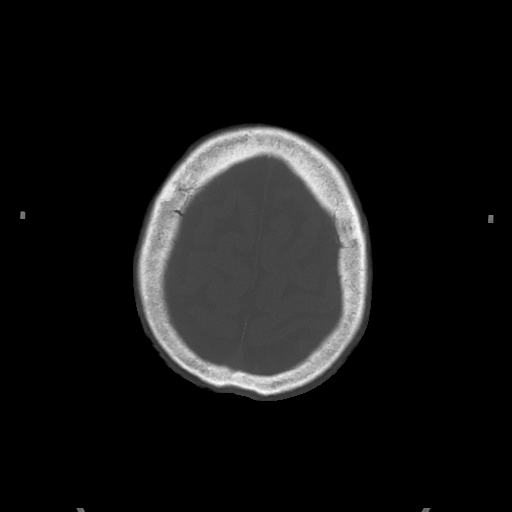

[Series 11: maxilllofacial 2.0 hr59 3 · axial · 0.38mm/px · z∈[-178,-88]mm · 4 of 76 slices shown]
[im 16/76  bone]
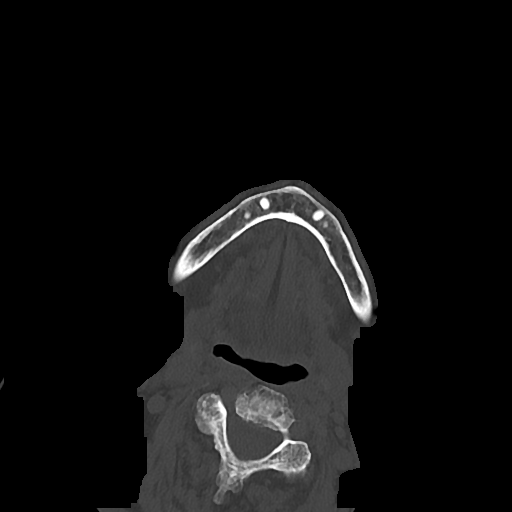
[im 31/76  bone]
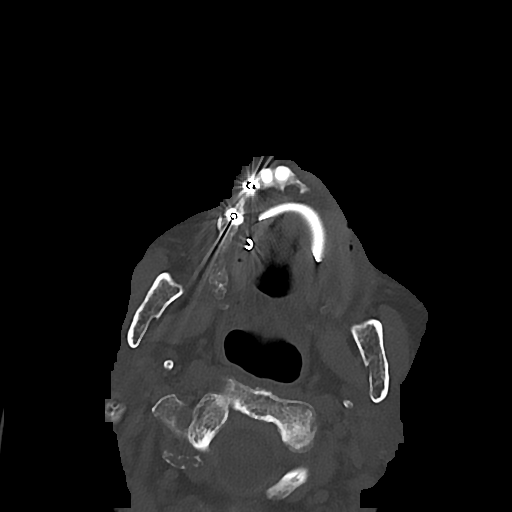
[im 46/76  bone]
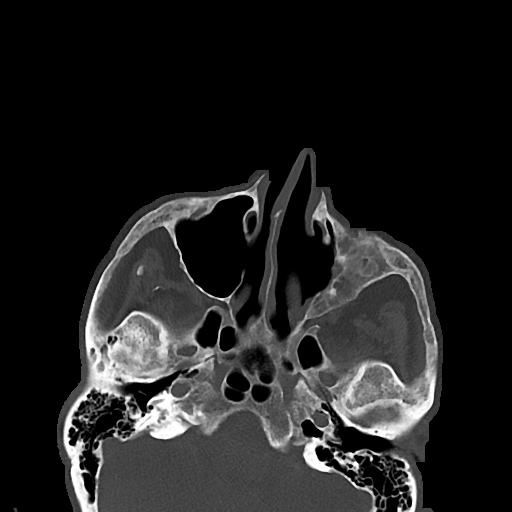
[im 61/76  bone]
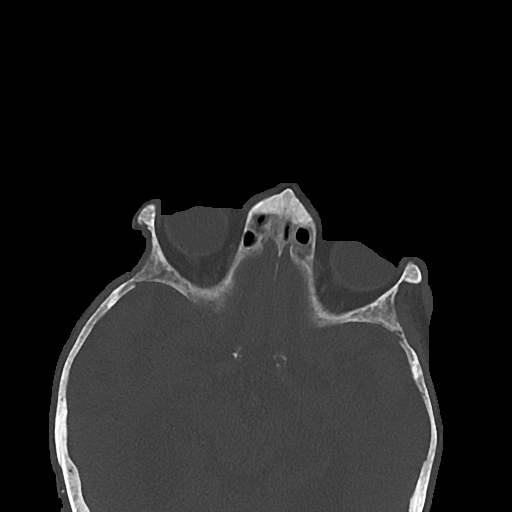

[Series 13: st cor · coronal · 0.29mm/px · 1 of 76 slices shown]
[im 38/76  bone]
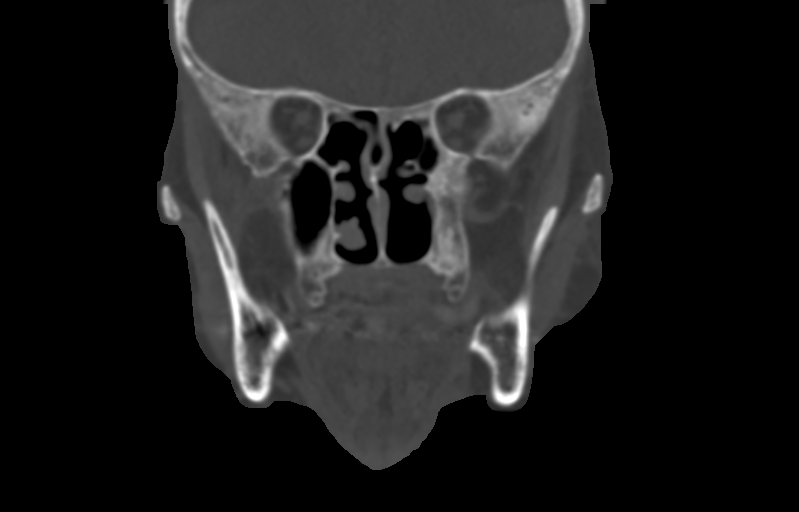

[14 of 47 positions shown; findings below may reference images not displayed]

FINDINGS: CT HEAD FINDINGS

BRAIN: No intraparenchymal hemorrhage, mass effect nor midline
shift. No acute large vascular territory infarcts. Confluent
supratentorial white matter hypodensities unchanged. Old basal
ganglia lacunar infarcts. No abnormal extra-axial fluid collections.
Basal cisterns are patent.

VASCULAR: Moderate to severe calcific atherosclerosis carotid siphon
and included vertebral arteries.

SKULL/SOFT TISSUES: No skull fracture. Moderate LEFT frontal scalp
hematoma without subcutaneous gas or radiopaque foreign bodies.

OTHER: None.

CT MAXILLOFACIAL FINDINGS

OSSEOUS: No acute facial fracture. The mandible is intact, the
condyles are located. No destructive bony lesions. Severe
temporomandibular osteoarthrosis. Multiple absent teeth.

ORBITS: Ocular globes and orbital contents are nonacute. Status post
bilateral ocular lens implants.

SINUSES: Severely atretic LEFT maxillary sinus with chronic
remodeling. Status post FESS. Mild fronto ethmoidal mucosal
thickening. Mastoid air cells are well aerated.

SOFT TISSUES: No significant soft tissue swelling. No subcutaneous
gas or radiopaque foreign bodies.

CT CERVICAL SPINE FINDINGS

ALIGNMENT: Maintenance of cervical lordosis. Minimal grade 1 C 5 6
anterolisthesis, unchanged.

SKULL BASE AND VERTEBRAE: Cervical vertebral bodies intact. Stable
severe C6-7 degenerative disc, moderate C3-4 and C5-6. Osteopenia
without destructive bony lesions. C1-2 articulation maintained with
moderate arthrosis.

SOFT TISSUES AND SPINAL CANAL: Nonacute. Moderate calcific
atherosclerosis carotid bifurcations.

DISC LEVELS: No high-grade osseous canal stenosis or neural
foraminal narrowing.

UPPER CHEST: Biapical pleuroparenchymal scarring with RIGHT apical
calcified pleural plaque.

OTHER: None.
IMPRESSION: CT HEAD:

1. No acute intracranial process. Moderate LEFT frontal scalp
hematoma.
2. Stable examination including severe chronic small vessel ischemic
changes and old lacunar infarcts.

CT MAXILLOFACIAL:

1. No facial fracture.

CT CERVICAL SPINE:

1. No fracture. Stable grade 1 C5-6 anterolisthesis on a
degenerative basis.
